# Patient Record
Sex: Female | Born: 1975 | Race: White | Hispanic: No | Marital: Married | Smoking: Former smoker
Health system: Southern US, Community
[De-identification: ages and names within clinical notes are randomized; demographics above are authoritative.]

## PROBLEM LIST (undated history)

## (undated) ENCOUNTER — Inpatient Hospital Stay (HOSPITAL_COMMUNITY): Payer: Self-pay

## (undated) DIAGNOSIS — J45909 Unspecified asthma, uncomplicated: Secondary | ICD-10-CM

## (undated) DIAGNOSIS — D649 Anemia, unspecified: Secondary | ICD-10-CM

## (undated) DIAGNOSIS — F419 Anxiety disorder, unspecified: Secondary | ICD-10-CM

## (undated) DIAGNOSIS — N6009 Solitary cyst of unspecified breast: Secondary | ICD-10-CM

## (undated) DIAGNOSIS — Z6721 Type B blood, Rh negative: Secondary | ICD-10-CM

## (undated) DIAGNOSIS — N83209 Unspecified ovarian cyst, unspecified side: Secondary | ICD-10-CM

## (undated) DIAGNOSIS — B999 Unspecified infectious disease: Secondary | ICD-10-CM

## (undated) HISTORY — DX: Unspecified infectious disease: B99.9

## (undated) HISTORY — DX: Unspecified asthma, uncomplicated: J45.909

## (undated) HISTORY — PX: WISDOM TOOTH EXTRACTION: SHX21

## (undated) HISTORY — DX: Anxiety disorder, unspecified: F41.9

## (undated) HISTORY — DX: Unspecified ovarian cyst, unspecified side: N83.209

## (undated) HISTORY — DX: Solitary cyst of unspecified breast: N60.09

## (undated) HISTORY — DX: Anemia, unspecified: D64.9

## (undated) HISTORY — DX: Type B blood, Rh negative: Z67.21

---

## 1999-12-14 ENCOUNTER — Other Ambulatory Visit: Admission: RE | Admit: 1999-12-14 | Discharge: 1999-12-14 | Payer: Self-pay | Admitting: Internal Medicine

## 2000-08-27 ENCOUNTER — Encounter: Payer: Self-pay | Admitting: Internal Medicine

## 2000-08-27 ENCOUNTER — Encounter: Admission: RE | Admit: 2000-08-27 | Discharge: 2000-08-27 | Payer: Self-pay | Admitting: Internal Medicine

## 2009-04-16 ENCOUNTER — Other Ambulatory Visit: Admission: RE | Admit: 2009-04-16 | Discharge: 2009-04-16 | Payer: Self-pay | Admitting: Family Medicine

## 2010-04-20 ENCOUNTER — Other Ambulatory Visit: Admission: RE | Admit: 2010-04-20 | Discharge: 2010-04-20 | Payer: Self-pay | Admitting: Family Medicine

## 2011-12-05 DIAGNOSIS — N6009 Solitary cyst of unspecified breast: Secondary | ICD-10-CM

## 2011-12-05 HISTORY — DX: Solitary cyst of unspecified breast: N60.09

## 2012-02-19 ENCOUNTER — Ambulatory Visit
Admission: RE | Admit: 2012-02-19 | Discharge: 2012-02-19 | Disposition: A | Discharge status: Home / Self Care | Payer: BC Managed Care – PPO | Source: Ambulatory Visit | Attending: Physician Assistant | Admitting: Physician Assistant

## 2012-02-19 ENCOUNTER — Other Ambulatory Visit: Payer: Self-pay | Admitting: Physician Assistant

## 2012-02-19 DIAGNOSIS — R059 Cough, unspecified: Secondary | ICD-10-CM

## 2012-02-19 DIAGNOSIS — R05 Cough: Secondary | ICD-10-CM

## 2012-03-08 ENCOUNTER — Other Ambulatory Visit: Payer: Self-pay | Admitting: Physician Assistant

## 2012-03-08 ENCOUNTER — Other Ambulatory Visit (HOSPITAL_COMMUNITY)
Admission: RE | Admit: 2012-03-08 | Discharge: 2012-03-08 | Disposition: A | Discharge status: Home / Self Care | Payer: BC Managed Care – PPO | Source: Ambulatory Visit | Attending: Family Medicine | Admitting: Family Medicine

## 2012-03-08 DIAGNOSIS — Z124 Encounter for screening for malignant neoplasm of cervix: Secondary | ICD-10-CM | POA: Insufficient documentation

## 2012-07-24 ENCOUNTER — Telehealth: Payer: Self-pay | Admitting: Obstetrics and Gynecology

## 2012-07-24 NOTE — Telephone Encounter (Signed)
TRIAGE/OB °

## 2012-07-26 ENCOUNTER — Encounter: Payer: Self-pay | Admitting: Obstetrics and Gynecology

## 2012-07-26 ENCOUNTER — Ambulatory Visit (INDEPENDENT_AMBULATORY_CARE_PROVIDER_SITE_OTHER): Payer: BC Managed Care – PPO | Admitting: Obstetrics and Gynecology

## 2012-07-26 VITALS — BP 100/58 | Ht 65.0 in | Wt 127.0 lb

## 2012-07-26 DIAGNOSIS — O219 Vomiting of pregnancy, unspecified: Secondary | ICD-10-CM

## 2012-07-26 MED ORDER — ONDANSETRON HCL 4 MG PO TABS: 4.0000 mg | ORAL_TABLET | Freq: Three times a day (TID) | ORAL | Status: AC | PRN

## 2012-07-26 NOTE — Progress Notes (Signed)
  Subjective:    Stacey Estrada is a 36 y.o. female, G1P0.  Pt recently found out she was pregnant approx 8w 2d by LMP of 05/29/12, c/o nausea x2 weeks and diarrhea x3 weeks. Pt has NOB interview scheduled 08/08/12.Marland Kitchen  Upon awakening feels nausea associated with need to have BM, vomiting with brushing teeth, then able to tolerate diet but feels nausea all day worse at the end of the day. Has divided meals in 6. Quit smoking 2010. No caffeine. Has not tried any medication. No weight loss.  The following portions of the patient's history were reviewed and updated as appropriate: allergies, current medications, past family history.  Review of Systems Pertinent items are noted in HPI. Breast:Negative for breast lump,nipple discharge or nipple retraction Gastrointestinal: Negative for abdominal pain, change in bowel habits or rectal bleeding Urinary:negative   Objective:    BP 100/58  Ht 5\' 5"  (1.651 m)  Wt 127 lb (57.607 kg)  BMI 21.13 kg/m2  LMP 05/29/2012    Weight:  Wt Readings from Last 1 Encounters:  07/26/12 127 lb (57.607 kg)          BMI: Body mass index is 21.13 kg/(m^2).  General Appearance: Alert, appropriate appearance for age. No acute distress   Assessment:    Nausea of pregnancy    Plan:    Options reviewed: Doxylamine/vitamin B6, Phenergan, zofran Will try zofran to help with diarrhea as well Diet reviewed. Stop PNV for now     [facility]

## 2012-08-02 ENCOUNTER — Telehealth: Payer: Self-pay | Admitting: Obstetrics and Gynecology

## 2012-08-02 NOTE — Telephone Encounter (Signed)
Triage/general quest. 

## 2012-08-08 ENCOUNTER — Ambulatory Visit (INDEPENDENT_AMBULATORY_CARE_PROVIDER_SITE_OTHER): Payer: Self-pay | Admitting: Obstetrics and Gynecology

## 2012-08-08 DIAGNOSIS — Z331 Pregnant state, incidental: Secondary | ICD-10-CM

## 2012-08-08 NOTE — Progress Notes (Signed)
NOB interview completed.  Pt left without leaving a urine sample, could not void at time of request and pt left office before getting a urine sample.  Pt will have urine sample done @ NOB work up on 08/22/12 w/ [facility].

## 2012-08-09 LAB — PRENATAL PANEL VII
Antibody Screen: NEGATIVE
Basophils Absolute: 0 10*3/uL (ref 0.0–0.1)
Basophils Relative: 0 % (ref 0–1)
Eosinophils Absolute: 0 10*3/uL (ref 0.0–0.7)
Eosinophils Relative: 1 % (ref 0–5)
HCT: 33.3 % — ABNORMAL LOW (ref 36.0–46.0)
HIV: NONREACTIVE
Hemoglobin: 11.4 g/dL — ABNORMAL LOW (ref 12.0–15.0)
Hepatitis B Surface Ag: NEGATIVE
Lymphocytes Relative: 18 % (ref 12–46)
Lymphs Abs: 1.4 10*3/uL (ref 0.7–4.0)
MCH: 29.9 pg (ref 26.0–34.0)
MCHC: 34.2 g/dL (ref 30.0–36.0)
MCV: 87.4 fL (ref 78.0–100.0)
Monocytes Absolute: 1.1 10*3/uL — ABNORMAL HIGH (ref 0.1–1.0)
Monocytes Relative: 14 % — ABNORMAL HIGH (ref 3–12)
Neutro Abs: 5.4 10*3/uL (ref 1.7–7.7)
Neutrophils Relative %: 67 % (ref 43–77)
Platelets: 292 10*3/uL (ref 150–400)
RBC: 3.81 MIL/uL — ABNORMAL LOW (ref 3.87–5.11)
RDW: 14 % (ref 11.5–15.5)
Rh Type: NEGATIVE
Rubella: 35.9 IU/mL — ABNORMAL HIGH
WBC: 8 10*3/uL (ref 4.0–10.5)

## 2012-08-14 ENCOUNTER — Telehealth: Payer: Self-pay

## 2012-08-14 MED ORDER — ONDANSETRON HCL 8 MG PO TABS: 8.0000 mg | ORAL_TABLET | Freq: Three times a day (TID) | ORAL | Status: DC | PRN

## 2012-08-14 NOTE — Telephone Encounter (Signed)
Zofran refilled 

## 2012-08-16 ENCOUNTER — Telehealth: Payer: Self-pay | Admitting: Obstetrics and Gynecology

## 2012-08-16 NOTE — Telephone Encounter (Signed)
PC to insurance company in order to get preauthorization for Zofran.  Approved with ID # of [id number].  Pt notified that she should have no problems getting refills of her med. Pt EXTREMELY thankful.  ld

## 2012-08-16 NOTE — Telephone Encounter (Signed)
LAURA/REFILL REQ

## 2012-08-22 ENCOUNTER — Ambulatory Visit (INDEPENDENT_AMBULATORY_CARE_PROVIDER_SITE_OTHER): Payer: BC Managed Care – PPO

## 2012-08-22 VITALS — BP 120/60 | Wt 129.0 lb

## 2012-08-22 DIAGNOSIS — Z789 Other specified health status: Secondary | ICD-10-CM

## 2012-08-22 DIAGNOSIS — Z8279 Family history of other congenital malformations, deformations and chromosomal abnormalities: Secondary | ICD-10-CM | POA: Insufficient documentation

## 2012-08-22 DIAGNOSIS — Z8659 Personal history of other mental and behavioral disorders: Secondary | ICD-10-CM

## 2012-08-22 DIAGNOSIS — Z862 Personal history of diseases of the blood and blood-forming organs and certain disorders involving the immune mechanism: Secondary | ICD-10-CM

## 2012-08-22 DIAGNOSIS — Z6721 Type B blood, Rh negative: Secondary | ICD-10-CM | POA: Insufficient documentation

## 2012-08-22 HISTORY — DX: Type B blood, Rh negative: Z67.21

## 2012-08-22 NOTE — Progress Notes (Signed)
Declines genetic screenings  Pap done Feb or Mar 2013

## 2012-09-16 DIAGNOSIS — Z331 Pregnant state, incidental: Secondary | ICD-10-CM | POA: Insufficient documentation

## 2012-09-19 ENCOUNTER — Ambulatory Visit (INDEPENDENT_AMBULATORY_CARE_PROVIDER_SITE_OTHER): Payer: Self-pay | Admitting: Obstetrics and Gynecology

## 2012-09-19 VITALS — BP 100/70 | Wt 136.5 lb

## 2012-09-19 DIAGNOSIS — Z331 Pregnant state, incidental: Secondary | ICD-10-CM

## 2012-09-19 NOTE — Progress Notes (Signed)
[redacted]w[redacted]d No complaints

## 2012-09-19 NOTE — Progress Notes (Signed)
Patient ID: Stacey Estrada, female   DOB: Apr 25, 1976, 36 y.o.   MRN: [id number] [redacted]w[redacted]d Declines genetic testing. N,V resolving. Common discomforts of pg reviewed. Lavera Guise, CNM

## 2012-10-22 ENCOUNTER — Other Ambulatory Visit: Payer: Self-pay | Admitting: Obstetrics and Gynecology

## 2012-10-22 DIAGNOSIS — Z3689 Encounter for other specified antenatal screening: Secondary | ICD-10-CM

## 2012-10-23 ENCOUNTER — Other Ambulatory Visit: Payer: Self-pay | Admitting: Obstetrics and Gynecology

## 2012-10-23 ENCOUNTER — Ambulatory Visit (INDEPENDENT_AMBULATORY_CARE_PROVIDER_SITE_OTHER): Payer: BC Managed Care – PPO

## 2012-10-23 ENCOUNTER — Ambulatory Visit (INDEPENDENT_AMBULATORY_CARE_PROVIDER_SITE_OTHER): Payer: BC Managed Care – PPO | Admitting: Obstetrics and Gynecology

## 2012-10-23 ENCOUNTER — Encounter: Payer: Self-pay | Admitting: Obstetrics and Gynecology

## 2012-10-23 VITALS — BP 100/54 | Wt 144.0 lb

## 2012-10-23 DIAGNOSIS — Z3689 Encounter for other specified antenatal screening: Secondary | ICD-10-CM

## 2012-10-23 DIAGNOSIS — J45909 Unspecified asthma, uncomplicated: Secondary | ICD-10-CM

## 2012-10-23 LAB — US OB DETAIL + 14 WK

## 2012-10-23 NOTE — Progress Notes (Signed)
Doing well. [place] today:  21 5/7 weeks, EDC 02/28/13, c/w dates.  Cervix 3.40, anterior placenta. Normal anatomy and fluid.  Female. Declined genetic testing. Glucola at 28-29 weeks.

## 2012-10-23 NOTE — Progress Notes (Signed)
Pt stated no issues today.  

## 2012-11-18 ENCOUNTER — Encounter: Payer: BC Managed Care – PPO | Admitting: Obstetrics and Gynecology

## 2012-11-21 ENCOUNTER — Ambulatory Visit (INDEPENDENT_AMBULATORY_CARE_PROVIDER_SITE_OTHER): Payer: BC Managed Care – PPO | Admitting: Obstetrics and Gynecology

## 2012-11-21 ENCOUNTER — Encounter: Payer: Self-pay | Admitting: Obstetrics and Gynecology

## 2012-11-21 VITALS — BP 112/60 | Wt 150.0 lb

## 2012-11-21 DIAGNOSIS — Z331 Pregnant state, incidental: Secondary | ICD-10-CM

## 2012-11-21 MED ORDER — PANTOPRAZOLE SODIUM 40 MG PO TBEC: 40.0000 mg | DELAYED_RELEASE_TABLET | Freq: Every day | ORAL | Status: DC

## 2012-11-21 NOTE — Progress Notes (Signed)
[redacted]w[redacted]d Pt has no complaints  Pt declines flu vaccine

## 2012-11-21 NOTE — Progress Notes (Signed)
[redacted]w[redacted]d  GFM Some nausea at night with GERD: protonix prescribed and diet reviewed

## 2012-12-04 NOTE — L&D Delivery Note (Signed)
Delivery Note Pushing started around 0355.  Pushed in Lt lateral position for entire stage.  Pushed great to SVD at 4:40 AM a viable female (name TBA) was delivered via Vaginal, Spontaneous Delivery (Presentation: Right Occiput Anterior).  APGAR: 8, 9; weight TBA .  Newborn w/ spontaneous cry as body delivered; dried and stimulated on mom's abdomen.  Cord doubly clamped and cut by FOB. Placenta status: Intact, Spontaneous, partial [place].  Cord: 3 vessels with the following complications: None.  Cord pH: not collected  Anesthesia: Other Epidural  Episiotomy: None Lacerations: 2nd degree perineal;1st degree; bilateral Labial (Lt repaired; Rt hemostatic) Suture Repair: 3-0 monocryl perineal; 4-0 monocryl labial Est. Blood Loss (mL): 350  Mom to postpartum.  Baby to nursery-stable. BF'ng initiated in L&D recovery.  Desires inpatient circumcision. Will CTO BP's closely, but all WNL currently. Mom, baby, and family all bonding well.   Edouard Gikas H 02/20/2013, 6:52 AM

## 2012-12-12 ENCOUNTER — Encounter: Payer: Self-pay | Admitting: Obstetrics and Gynecology

## 2012-12-12 ENCOUNTER — Ambulatory Visit (INDEPENDENT_AMBULATORY_CARE_PROVIDER_SITE_OTHER): Payer: BC Managed Care – PPO | Admitting: Obstetrics and Gynecology

## 2012-12-12 ENCOUNTER — Other Ambulatory Visit: Payer: BC Managed Care – PPO

## 2012-12-12 VITALS — BP 96/56 | Wt 154.0 lb

## 2012-12-12 DIAGNOSIS — Z331 Pregnant state, incidental: Secondary | ICD-10-CM

## 2012-12-12 DIAGNOSIS — Z3400 Encounter for supervision of normal first pregnancy, unspecified trimester: Secondary | ICD-10-CM

## 2012-12-12 LAB — CBC
HCT: 30.3 % — ABNORMAL LOW (ref 36.0–46.0)
Hemoglobin: 10.4 g/dL — ABNORMAL LOW (ref 12.0–15.0)
MCH: 29.6 pg (ref 26.0–34.0)
MCHC: 34.3 g/dL (ref 30.0–36.0)
MCV: 86.3 fL (ref 78.0–100.0)
Platelets: 238 10*3/uL (ref 150–400)
RBC: 3.51 MIL/uL — ABNORMAL LOW (ref 3.87–5.11)
RDW: 13.2 % (ref 11.5–15.5)
WBC: 7 10*3/uL (ref 4.0–10.5)

## 2012-12-12 NOTE — Progress Notes (Signed)
[redacted]w[redacted]d. Pt is B Neg . She declined flu vaccine. No complaints or concerns today.

## 2012-12-12 NOTE — Progress Notes (Signed)
[redacted]w[redacted]d Doing well. Glucola, hemoglobin, RPR today. Patient is Rh- but so is her husband. Return to office in 2 weeks. Childbirth classes, preterm labor, contraception, circumcision, breast-feeding, exercise, nutrition discussed. Dr. Stefano Gaul

## 2012-12-13 LAB — GLUCOSE TOLERANCE, 1 HOUR (50G) W/O FASTING: Glucose, 1 Hour GTT: 104 mg/dL (ref 70–140)

## 2012-12-13 LAB — RPR

## 2012-12-28 IMAGING — CR DG CHEST 2V
2 series · 2 of 2 positions shown · non-contrast
Comparison: None.

CLINICAL DATA: Cough for 3 weeks, former smoking history

CHEST - 2 VIEW

[view not recorded (1 of 2)]
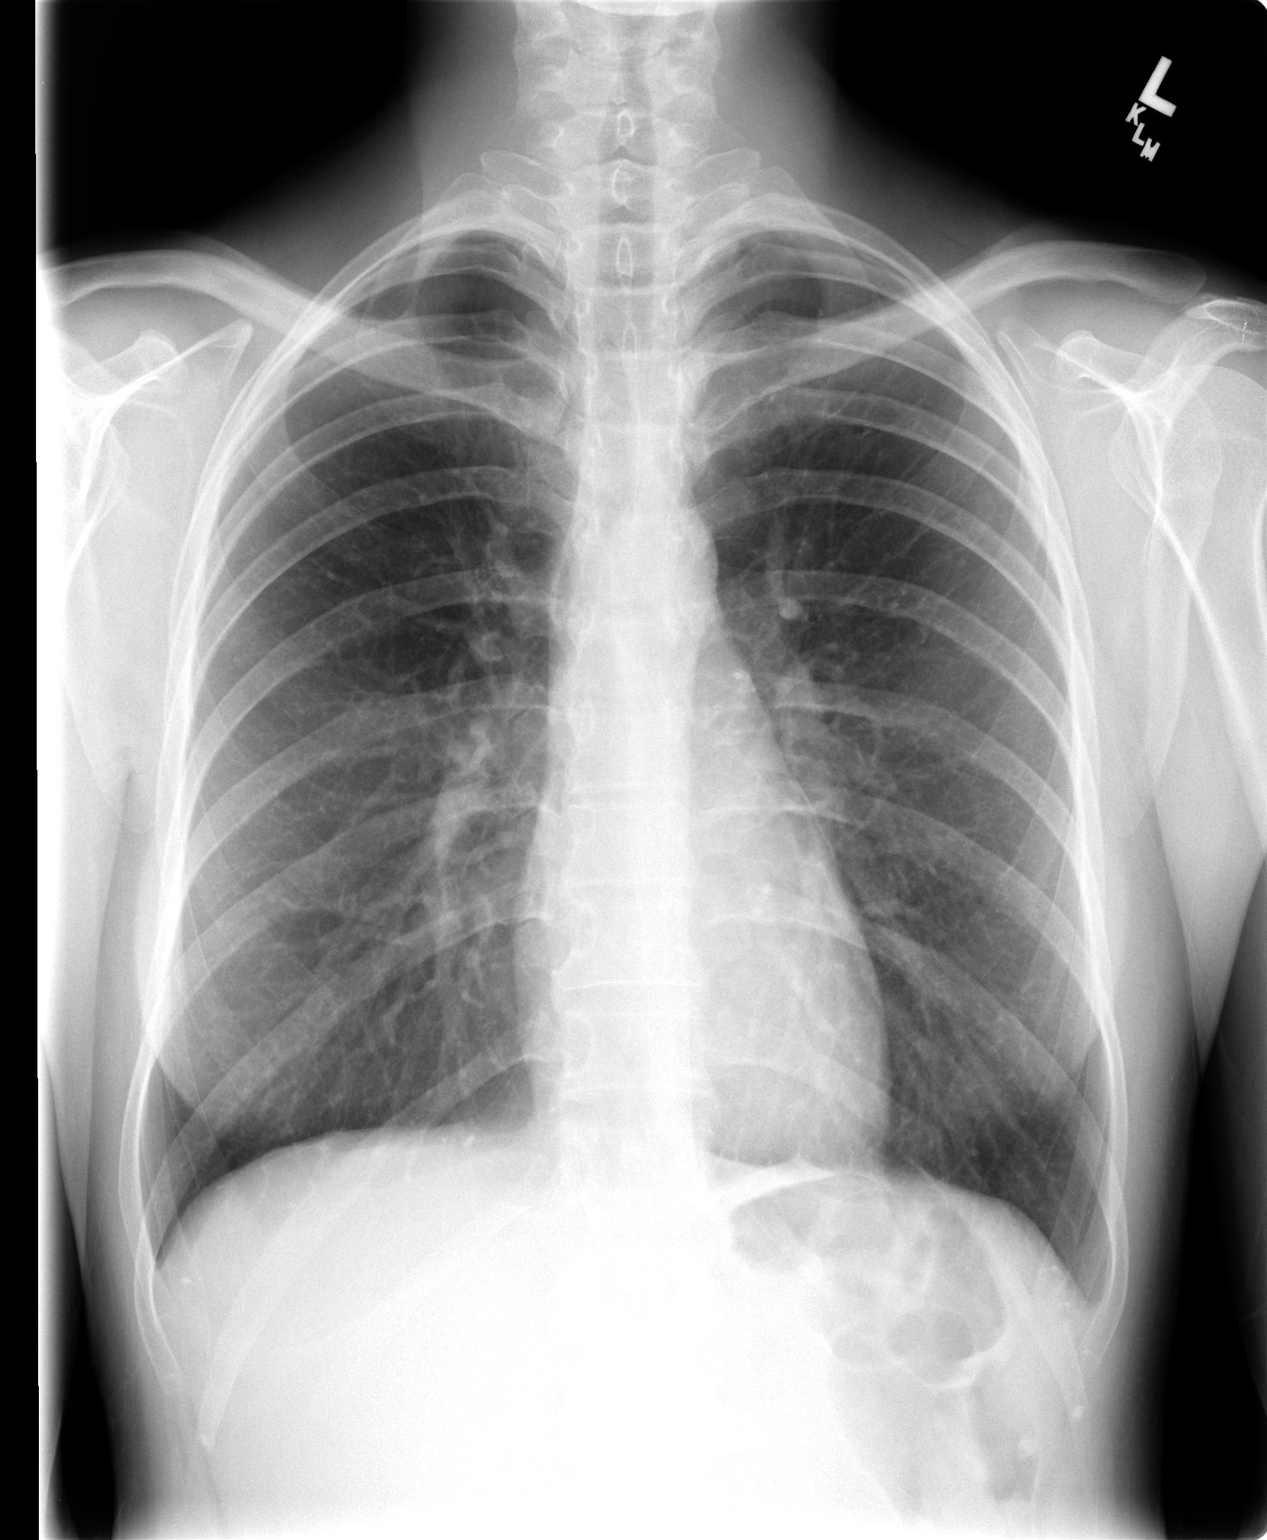

[view not recorded (2 of 2)]
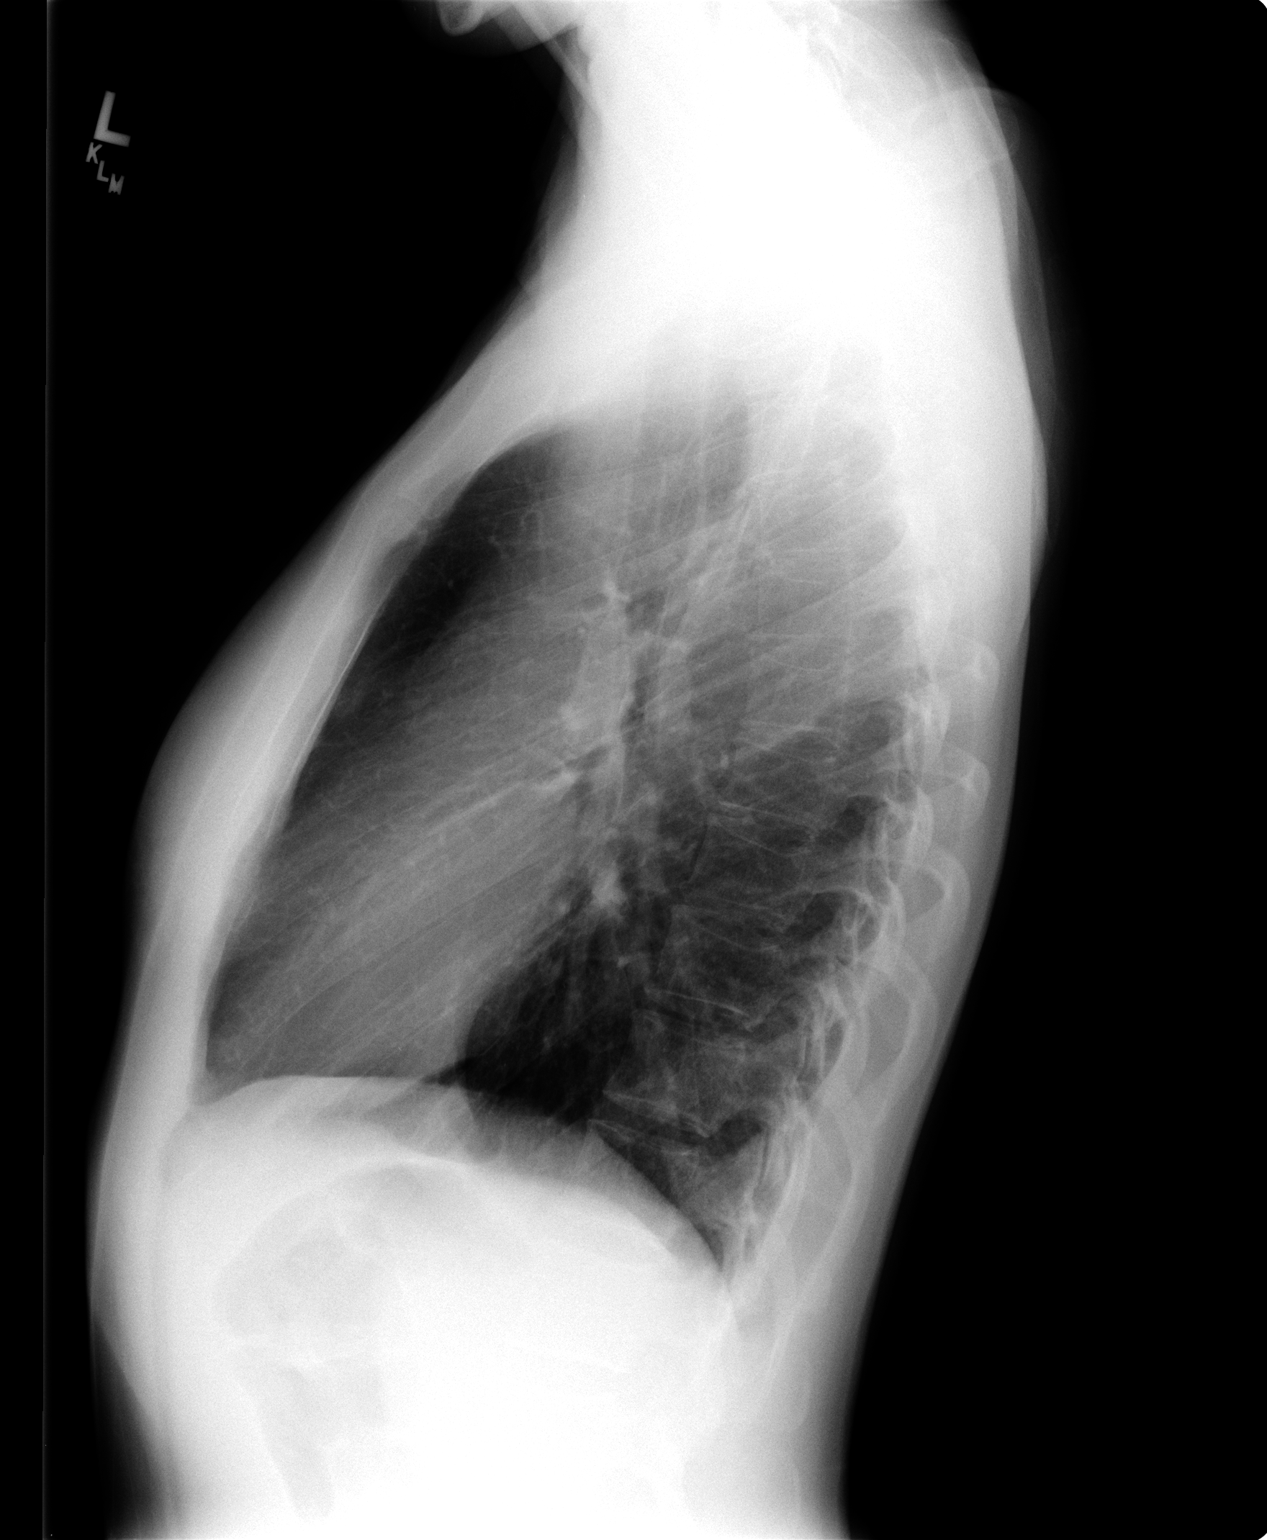

[2 of 2 positions shown; findings below may reference images not displayed]

FINDINGS: The lungs are clear.  Mediastinal contours appear normal.
The heart is within normal limits in size.  No bony abnormality is
seen.
IMPRESSION: No active lung disease.

## 2012-12-31 ENCOUNTER — Ambulatory Visit: Payer: BC Managed Care – PPO | Admitting: Obstetrics and Gynecology

## 2012-12-31 VITALS — BP 104/60 | Wt 157.0 lb

## 2012-12-31 DIAGNOSIS — J45909 Unspecified asthma, uncomplicated: Secondary | ICD-10-CM

## 2012-12-31 MED ORDER — ALBUTEROL SULFATE HFA 108 (90 BASE) MCG/ACT IN AERS: 2.0000 | INHALATION_SPRAY | Freq: Four times a day (QID) | RESPIRATORY_TRACT | Status: AC | PRN

## 2012-12-31 NOTE — Progress Notes (Signed)
[redacted]w[redacted]d 1 hour GTT = 104 Pt states no complaints today.

## 2012-12-31 NOTE — Progress Notes (Signed)
Doing well.  Needs refill on Albuterol inhaler--sent. B negative--husband is same blood type, so no Rhophylac needed. Some ligament pain, reflux, and occasional BHs. All issues reviewed and tips given.  Already on Protonix. S/S PTL reviewed.

## 2013-01-16 ENCOUNTER — Encounter: Payer: Self-pay | Admitting: Obstetrics and Gynecology

## 2013-01-16 ENCOUNTER — Ambulatory Visit: Payer: BC Managed Care – PPO | Admitting: Obstetrics and Gynecology

## 2013-01-16 VITALS — BP 100/62 | Wt 156.0 lb

## 2013-01-16 DIAGNOSIS — Z3490 Encounter for supervision of normal pregnancy, unspecified, unspecified trimester: Secondary | ICD-10-CM

## 2013-01-16 NOTE — Progress Notes (Signed)
[redacted]w[redacted]d No problems today.

## 2013-01-16 NOTE — Progress Notes (Signed)
Doing well. Vtx to [facility]. GBS discussed--will do next visit.

## 2013-01-22 ENCOUNTER — Inpatient Hospital Stay (HOSPITAL_COMMUNITY)
Admission: AD | Admit: 2013-01-22 | Discharge: 2013-01-22 | Disposition: A | Discharge status: Home / Self Care | Payer: BC Managed Care – PPO | Source: Ambulatory Visit | Attending: Obstetrics and Gynecology | Admitting: Obstetrics and Gynecology

## 2013-01-22 ENCOUNTER — Encounter (HOSPITAL_COMMUNITY): Payer: Self-pay | Admitting: *Deleted

## 2013-01-22 ENCOUNTER — Telehealth: Payer: Self-pay | Admitting: Obstetrics and Gynecology

## 2013-01-22 DIAGNOSIS — R0602 Shortness of breath: Secondary | ICD-10-CM | POA: Insufficient documentation

## 2013-01-22 DIAGNOSIS — R079 Chest pain, unspecified: Secondary | ICD-10-CM | POA: Insufficient documentation

## 2013-01-22 DIAGNOSIS — O99891 Other specified diseases and conditions complicating pregnancy: Secondary | ICD-10-CM | POA: Insufficient documentation

## 2013-01-22 DIAGNOSIS — R42 Dizziness and giddiness: Secondary | ICD-10-CM | POA: Insufficient documentation

## 2013-01-22 LAB — COMPREHENSIVE METABOLIC PANEL
ALT: 9 U/L (ref 0–35)
AST: 16 U/L (ref 0–37)
Albumin: 2.9 g/dL — ABNORMAL LOW (ref 3.5–5.2)
Alkaline Phosphatase: 130 U/L — ABNORMAL HIGH (ref 39–117)
BUN: 8 mg/dL (ref 6–23)
CO2: 21 mEq/L (ref 19–32)
Calcium: 8.6 mg/dL (ref 8.4–10.5)
Chloride: 106 mEq/L (ref 96–112)
Creatinine, Ser: 0.55 mg/dL (ref 0.50–1.10)
GFR calc Af Amer: >90 mL/min (ref >90)
GFR calc non Af Amer: >90 mL/min (ref >90)
Glucose, Bld: 77 mg/dL (ref 70–99)
Potassium: 4.2 mEq/L (ref 3.5–5.1)
Sodium: 135 mEq/L (ref 135–145)
Total Bilirubin: 0.1 mg/dL — ABNORMAL LOW (ref 0.3–1.2)
Total Protein: 7.2 g/dL (ref 6.0–8.3)

## 2013-01-22 LAB — CBC
HCT: 35.4 % — ABNORMAL LOW (ref 36.0–46.0)
Hemoglobin: 11.6 g/dL — ABNORMAL LOW (ref 12.0–15.0)
MCH: 28.2 pg (ref 26.0–34.0)
MCHC: 32.8 g/dL (ref 30.0–36.0)
MCV: 86.1 fL (ref 78.0–100.0)
Platelets: 223 10*3/uL (ref 150–400)
RBC: 4.11 MIL/uL (ref 3.87–5.11)
RDW: 12.8 % (ref 11.5–15.5)
WBC: 7.4 10*3/uL (ref 4.0–10.5)

## 2013-01-22 MED ORDER — GI COCKTAIL ~~LOC~~
30.0000 mL | Freq: Once | ORAL | Status: AC
  Administered 2013-01-22: 30 mL via ORAL
  Filled 2013-01-22: qty 30

## 2013-01-22 NOTE — MAU Note (Signed)
Chest pressure, shortness of breath, started on Sat, seemed to increase; using inhaler more- but not seem to be helping.

## 2013-01-22 NOTE — MAU Note (Signed)
Lab called back for draw. resp called for ECG

## 2013-01-22 NOTE — Progress Notes (Signed)
CMP     Component Value Date/Time   NA 135 01/22/2013 1030   K 4.2 01/22/2013 1030   CL 106 01/22/2013 1030   CO2 21 01/22/2013 1030   GLUCOSE 77 01/22/2013 1030   BUN 8 01/22/2013 1030   CREATININE 0.55 01/22/2013 1030   CALCIUM 8.6 01/22/2013 1030   PROT 7.2 01/22/2013 1030   ALBUMIN 2.9* 01/22/2013 1030   AST 16 01/22/2013 1030   ALT 9 01/22/2013 1030   ALKPHOS 130* 01/22/2013 1030   BILITOT 0.1* 01/22/2013 1030   GFRNONAA >90 01/22/2013 1030   GFRAA >90 01/22/2013 1030    The patient reports that she feels better after her GI cocktail.  Discharge the patient home. The patient will followup in the office in 1-2 weeks. She will call for questions or concerns.  Dr. Stefano Gaul

## 2013-01-22 NOTE — MAU Note (Signed)
Patient states for the past few days she has been having a lot of chest fullness and tighteness, has shortness of breath and slight cough, no sore throat or fever. Denies contractions, bleeding, leaking and reports good fetal movement.

## 2013-01-22 NOTE — Consult Note (Signed)
DATE: 01/22/2013  Maternity Admissions Unit History and Physical Exam for an Obstetrics Patient  [name] is a 37 y.o. female, G1P0, at [redacted]w[redacted]d gestation, who presents for evaluation of shortness of breath and chest pain. She has been followed at the [facility]cology division of [facility].  Her pregnancy has been complicated by a history of asthma and gastroesophageal reflux. The patient also has a history of anxiety. She reports for the last week she has had increased shortness of breath with ambulation. She reports having a vague pain in her chest. She has had tingling in her arms and in her face. She currently takes Protonix. She has no GI or GU complaints. See history below.  OB History   Grav Para Term Preterm Abortions TAB SAB Ect Mult Living   1               Past Medical History  Diagnosis Date  . Ovarian cyst   . Anemia     low iron;iron supplements in past, however stomach upset  . Asthma     Triggered by streunous exercise, anxiety and heat;inhaler prn  . Infection     UTI;hx of frequent UTI;last UTI 2012 x 1  . Anxiety     No meds  . Ruptured ovarian cyst     Around late teens/early 20's  . Cyst (solitary) of breast 2013    GYN felt a  cyst in rt breast;upper area and lower area    Prescriptions prior to admission  Medication Sig Dispense Refill  . acetaminophen (TYLENOL) 500 MG tablet Take 1,000 mg by mouth daily as needed for pain.      Marland Kitchen albuterol (PROVENTIL HFA;VENTOLIN HFA) 108 (90 BASE) MCG/ACT inhaler Inhale 2 puffs into the lungs every 6 (six) hours as needed for wheezing.  1 Inhaler  2  . pantoprazole (PROTONIX) 40 MG tablet Take 40 mg by mouth at bedtime.      . pseudoephedrine (SUDAFED) 120 MG 12 hr tablet Take 120 mg by mouth every 12 (twelve) hours as needed for congestion.      . [DISCONTINUED] pantoprazole (PROTONIX) 40 MG tablet Take 1 tablet (40 mg total) by mouth daily.  30 tablet  6    Past  Surgical History  Procedure Laterality Date  . Wisdom tooth extraction      No Known Allergies  Family History: family history includes Anemia in her paternal grandmother; Asthma in her mother; Cancer in her maternal grandmother; Dementia in her maternal grandmother; Hypertension in her paternal grandmother; Migraines in her paternal grandmother; and Scoliosis in her brother.  Social History:  reports that she quit smoking about 7 months ago. Her smoking use included Cigarettes. She smoked 0.00 packs per day. She has never used smokeless tobacco. She reports that she does not drink alcohol or use illicit drugs.  Review of systems: Normal pregnancy complaints.  Admission Physical Exam:    Body mass index is 25.86 kg/(m^2).  Blood pressure 115/80, pulse 90, temperature 98.6 F (37 C), temperature source Oral, resp. rate 16, height 5\' 5"  (1.651 m), weight 155 lb 6.4 oz (70.489 kg), last menstrual period 05/29/2012, SpO2 100.00%.  HEENT:                 Within normal limits Chest:                   Clear, no E To A changes Heart:  Regular rate and rhythm Abdomen:             Gravid and nontender, FH = 34 cm Extremities:          Grossly normal Neurologic exam: Grossly normal Pelvic exam:         Cervix: Not examined  Prenatal labs: ABO, Rh:             B/NEG/-- (09/05 1546) Antibody:              NEG (09/05 1546) Rubella:                  immune RPR:                    NON REAC (01/09 1711)  HBsAg:                 NEGATIVE (09/05 1546)  HIV:                       NON REACTIVE (09/05 1546)                      NST: Category 1; Contractions: few .   CBC    Component Value Date/Time   WBC 7.4 01/22/2013 1030   RBC 4.11 01/22/2013 1030   HGB 11.6* 01/22/2013 1030   HCT 35.4* 01/22/2013 1030   PLT 223 01/22/2013 1030   MCV 86.1 01/22/2013 1030   MCH 28.2 01/22/2013 1030   MCHC 32.8 01/22/2013 1030   RDW 12.8 01/22/2013 1030   LYMPHSABS 1.4 08/08/2012 1546   MONOABS  1.1* 08/08/2012 1546   EOSABS 0.0 08/08/2012 1546   BASOSABS 0.0 08/08/2012 1546    EKG= normal  Wells Criteria Score: 0 ( unlikely that the patient has a pulmonary embolus)   Assessment:  [redacted]w[redacted]d gestation  Shortness of breath   Chest pain  Anemia  Anxiety  Gastroesophageal reflux disease  Plan:  The patient will be given a GI cocktail.  We will check a comprehensive metabolic panel.  I do not believe the patient has a pulmonary embolus nor an infectious process in her lungs. I do not think it is appropriate to do a chest x-ray or an arteriogram.  We will continue to observe. The patient can use Tylenol and antireflux medications as needed.  Return to office in one to 2 weeks or if her symptoms should continue.   Exie Chrismer V 01/22/2013, 11:14 AM

## 2013-01-22 NOTE — Telephone Encounter (Signed)
TC from pt. States since 01/18/13 has felt chest pressure and it is hard to breathe. Especially feels heaviness in lt side. Has asthma and has used inhaler but has not helped. Has usual asthma cough but theses symptoms feels different. +FM. No cont.  Per DR AVS, pt to MAU. Pt verbalizes comprehension.

## 2013-01-30 ENCOUNTER — Encounter: Payer: Self-pay | Admitting: Obstetrics and Gynecology

## 2013-01-30 ENCOUNTER — Ambulatory Visit: Payer: BC Managed Care – PPO | Admitting: Obstetrics and Gynecology

## 2013-01-30 VITALS — BP 102/62 | Wt 158.0 lb

## 2013-01-30 DIAGNOSIS — Z331 Pregnant state, incidental: Secondary | ICD-10-CM

## 2013-01-30 DIAGNOSIS — R079 Chest pain, unspecified: Secondary | ICD-10-CM

## 2013-01-30 LAB — POCT WET PREP (WET MOUNT)
Bacteria Wet Prep HPF POC: NEGATIVE
Clue Cells Wet Prep Whiff POC: NEGATIVE
Trichomonas Wet Prep HPF POC: NEGATIVE
WBC, Wet Prep HPF POC: NEGATIVE

## 2013-01-30 MED ORDER — NYSTATIN-TRIAMCINOLONE 100000-0.1 UNIT/GM-% EX OINT: TOPICAL_OINTMENT | Freq: Two times a day (BID) | CUTANEOUS | Status: DC

## 2013-01-30 NOTE — Progress Notes (Signed)
[redacted]w[redacted]d Recent hospital visit due to L side chest pain. Sx's haven't subsided. Pt wants to discuss with VL. Pt is requesting info regarding leave of absence.

## 2013-01-30 NOTE — Progress Notes (Signed)
Still c/o L chest pain, tightness, and sporadic SOB.  Had w/u at MAU on 2/19 with negative labs and physical exam by Dr. Stefano Gaul. Patient reports hx of palpitations in past, without w/u--says these sensations are similar how she felt when those occurred, without the specific palpitations occurring. 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 Heart RRR without murmur. GBS done today. Wet prep negative, but has some external itching at introitus. Rx Mycolog II cream. 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  Note written today for patient to take. Will refer to cardiology for further evaluation.

## 2013-01-31 ENCOUNTER — Other Ambulatory Visit: Payer: Self-pay | Admitting: Obstetrics and Gynecology

## 2013-01-31 ENCOUNTER — Telehealth: Payer: Self-pay | Admitting: Obstetrics and Gynecology

## 2013-01-31 DIAGNOSIS — R079 Chest pain, unspecified: Secondary | ICD-10-CM

## 2013-01-31 NOTE — Telephone Encounter (Signed)
TC to pt. LM to return call.  

## 2013-01-31 NOTE — Telephone Encounter (Signed)
Message copied by Mason Jim on Fri Jan 31, 2013  3:12 PM ------      Message from: Cornelius Moras      Created: Thu Jan 30, 2013  9:30 PM      Regarding: Referral to cardiology       Please refer ASAP to Cardiology for evaluation of left sided chest pain and SOB at [redacted] weeks gestation.  Had preliminary w/u at [facility] on 2.19, but no CXR or EKG done.  Hx palpitations in past, none at present.  Pain and SOB have persisted.            Thanks!      VL ------

## 2013-01-31 NOTE — Telephone Encounter (Signed)
TC from pt. Informed of cardiology appt.  States +FM.

## 2013-01-31 NOTE — Telephone Encounter (Signed)
TC to pt. Lm to call ASAP. Appt scheduled at [facility] 02/03/13 at 11:45 with Dr Dietrich Pates.

## 2013-01-31 NOTE — Telephone Encounter (Signed)
Message copied by Mason Jim on Fri Jan 31, 2013 10:51 AM ------      Message from: Cornelius Moras      Created: Thu Jan 30, 2013  9:30 PM      Regarding: Referral to cardiology       Please refer ASAP to Cardiology for evaluation of left sided chest pain and SOB at [redacted] weeks gestation.  Had preliminary w/u at [facility] on 2.19, but no CXR or EKG done.  Hx palpitations in past, none at present.  Pain and SOB have persisted.            Thanks!      VL ------

## 2013-02-01 LAB — STREP B DNA PROBE: GBSP: NEGATIVE

## 2013-02-03 ENCOUNTER — Other Ambulatory Visit (HOSPITAL_COMMUNITY): Payer: BC Managed Care – PPO

## 2013-02-03 ENCOUNTER — Encounter: Payer: Self-pay | Admitting: Internal Medicine

## 2013-02-03 ENCOUNTER — Ambulatory Visit (INDEPENDENT_AMBULATORY_CARE_PROVIDER_SITE_OTHER): Payer: BC Managed Care – PPO | Admitting: Internal Medicine

## 2013-02-03 VITALS — BP 110/80 | HR 80 | Ht 65.0 in | Wt 156.0 lb

## 2013-02-03 DIAGNOSIS — R0789 Other chest pain: Secondary | ICD-10-CM

## 2013-02-03 NOTE — Progress Notes (Signed)
HPI First pregnancy  Patient is a 37 yo who was referred fro evaluation of CP and SOB Different sensations  1 is a tight feeling  1 is a tight tingly sensation near top of chest  Makes neck and face feel hot  Feels SOB  Not associated with activity. Has had palpitations  Isolated beats  Has decreased but the feeling of chest  Has increased.  Started 2 to 3 wks ago.  Using inhaler more   2x per day.  Doesn't help  Occasionally hears self wheezing.  Occasaionally feels like has to cough  No PND  No signif edema  No Known Allergies  Current Outpatient Prescriptions  Medication Sig Dispense Refill  . acetaminophen (TYLENOL) 500 MG tablet Take 1,000 mg by mouth daily as needed for pain.      Marland Kitchen albuterol (PROVENTIL HFA;VENTOLIN HFA) 108 (90 BASE) MCG/ACT inhaler Inhale 2 puffs into the lungs every 6 (six) hours as needed for wheezing.  1 Inhaler  2  . nystatin-triamcinolone ointment (MYCOLOG) Apply topically 2 (two) times daily.  30 g  1  . pantoprazole (PROTONIX) 40 MG tablet Take 40 mg by mouth at bedtime.      . pseudoephedrine (SUDAFED) 120 MG 12 hr tablet Take 120 mg by mouth every 12 (twelve) hours as needed for congestion.       No current facility-administered medications for this visit.    Past Medical History  Diagnosis Date  . Ovarian cyst   . Anemia     low iron;iron supplements in past, however stomach upset  . Asthma     Triggered by streunous exercise, anxiety and heat;inhaler prn  . Infection     UTI;hx of frequent UTI;last UTI 2012 x 1  . Anxiety     No meds  . Ruptured ovarian cyst     Around late teens/early 20's  . Cyst (solitary) of breast 2013    GYN felt a  cyst in rt breast;upper area and lower area    Past Surgical History  Procedure Laterality Date  . Wisdom tooth extraction      Family History  Problem Relation Age of Onset  . Hypertension Paternal Grandmother   . Migraines Paternal Grandmother   . Cancer Maternal Grandmother     lymphoma   .  Asthma Mother   . Scoliosis Brother   . Dementia Maternal Grandmother   . Anemia Paternal Grandmother     History   Social History  . Marital Status: Single    Spouse Name: Esther Bradstreet    Number of Children: 0  . Years of Education: 16   Occupational History  . 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 Smoking status: Former Smoker    Types: Cigarettes    Quit date: 06/12/2012  . Smokeless tobacco: Never Used  . Alcohol Use: No     Comment: occasionally;once/month  . Drug Use: No  . Sexually Active: Not Currently -- Female partner(s)    Birth Control/ Protection: Condom, Pill, Injection, Inserts, None   Other Topics Concern  . Not on file   Social History Narrative  . No narrative on file    Review of Systems:  All systems reviewed.  They are negative to the above problem except as previously stated.  Vital Signs: BP 110/80  Pulse 80  Ht 5\' 5"  (1.651 m)  Wt 156 lb (70.761 kg)  BMI 25.96 kg/m2  LMP 05/29/2012  Physical  Exam Patient is in NAD  HEENT:  Normocephalic, atraumatic. EOMI, PERRLA.  Neck: JVP is normal.  No bruits.  Lungs: clear to auscultation. No rales no wheezes.  Heart: Regular rate and rhythm. Normal S1, S2. No S3.   No significant murmurs. PMI not displaced.  Abdomen: Distended.   No hepatomegaly.  Extremities:   Good distal pulses throughout. No lower extremity edema.  Musculoskeletal :moving all extremities.  Neuro:   alert and oriented x3.  CN II-XII grossly intact.   Assessment and Plan:  1.  Chest tightness and SOB  Patient's volume status looks good.  Cardiac exam appears normal.  With symptoms and stage of pregnancy I would recomm echo just to confirm normal LV function.   If normal symptoms are probably related to increased size of uterus pressing on diaphragm along with increased volume/increased cardiac output.  Increased use of inhalers is also probably contributing to symptoms.  Her last day of work is Friday  I  encouraged her to rest, elevate legs  Do activities as tolerated

## 2013-02-03 NOTE — Patient Instructions (Addendum)
Your physician has requested that you have an echocardiogram. Echocardiography is a painless test that uses sound waves to create images of your heart. It provides your doctor with information about the size and shape of your heart and how well your heart's chambers and valves are working. This procedure takes approximately one hour. There are no restrictions for this procedure.   

## 2013-02-04 ENCOUNTER — Ambulatory Visit: Payer: BC Managed Care – PPO | Admitting: Obstetrics and Gynecology

## 2013-02-04 VITALS — BP 100/70 | Wt 159.0 lb

## 2013-02-04 DIAGNOSIS — Z331 Pregnant state, incidental: Secondary | ICD-10-CM

## 2013-02-04 NOTE — Progress Notes (Signed)
[redacted]w[redacted]d Pt has no concerns today.  GBS : done on 01-30-2013: NEGATIVE FKCs and Labor Precautions

## 2013-02-06 ENCOUNTER — Ambulatory Visit (HOSPITAL_COMMUNITY): Payer: BC Managed Care – PPO | Attending: Cardiology | Admitting: Radiology

## 2013-02-06 DIAGNOSIS — R072 Precordial pain: Secondary | ICD-10-CM

## 2013-02-06 DIAGNOSIS — R0789 Other chest pain: Secondary | ICD-10-CM | POA: Insufficient documentation

## 2013-02-06 NOTE — Progress Notes (Signed)
Echocardiogram performed.  

## 2013-02-11 ENCOUNTER — Encounter: Payer: BC Managed Care – PPO | Admitting: Obstetrics and Gynecology

## 2013-02-14 ENCOUNTER — Ambulatory Visit: Payer: BC Managed Care – PPO | Admitting: Obstetrics and Gynecology

## 2013-02-14 VITALS — BP 110/60 | Wt 158.0 lb

## 2013-02-14 DIAGNOSIS — J45909 Unspecified asthma, uncomplicated: Secondary | ICD-10-CM

## 2013-02-14 DIAGNOSIS — Z331 Pregnant state, incidental: Secondary | ICD-10-CM

## 2013-02-14 NOTE — Progress Notes (Signed)
[redacted]w[redacted]d Pt declines flu shot at this time Doing well

## 2013-02-19 ENCOUNTER — Telehealth: Payer: Self-pay | Admitting: Obstetrics and Gynecology

## 2013-02-19 ENCOUNTER — Encounter (HOSPITAL_COMMUNITY): Payer: Self-pay | Admitting: *Deleted

## 2013-02-19 ENCOUNTER — Inpatient Hospital Stay (HOSPITAL_COMMUNITY): Payer: BC Managed Care – PPO | Admitting: Anesthesiology

## 2013-02-19 ENCOUNTER — Inpatient Hospital Stay (HOSPITAL_COMMUNITY)
Admission: AD | Admit: 2013-02-19 | Discharge: 2013-02-22 | DRG: 373 | Disposition: A | Discharge status: Home / Self Care | Payer: BC Managed Care – PPO | Source: Ambulatory Visit | Attending: Obstetrics and Gynecology | Admitting: Obstetrics and Gynecology

## 2013-02-19 ENCOUNTER — Inpatient Hospital Stay (HOSPITAL_COMMUNITY)
Admission: AD | Admit: 2013-02-19 | Discharge: 2013-02-19 | Disposition: A | Discharge status: Home / Self Care | Payer: BC Managed Care – PPO | Source: Ambulatory Visit | Attending: Obstetrics and Gynecology | Admitting: Obstetrics and Gynecology

## 2013-02-19 ENCOUNTER — Encounter (HOSPITAL_COMMUNITY): Payer: Self-pay | Admitting: Anesthesiology

## 2013-02-19 DIAGNOSIS — D649 Anemia, unspecified: Secondary | ICD-10-CM | POA: Diagnosis not present

## 2013-02-19 DIAGNOSIS — O99892 Other specified diseases and conditions complicating childbirth: Secondary | ICD-10-CM | POA: Diagnosis present

## 2013-02-19 DIAGNOSIS — IMO0001 Reserved for inherently not codable concepts without codable children: Secondary | ICD-10-CM

## 2013-02-19 DIAGNOSIS — K219 Gastro-esophageal reflux disease without esophagitis: Secondary | ICD-10-CM | POA: Diagnosis present

## 2013-02-19 DIAGNOSIS — O9903 Anemia complicating the puerperium: Secondary | ICD-10-CM | POA: Diagnosis not present

## 2013-02-19 DIAGNOSIS — O09519 Supervision of elderly primigravida, unspecified trimester: Secondary | ICD-10-CM | POA: Diagnosis present

## 2013-02-19 DIAGNOSIS — O479 False labor, unspecified: Secondary | ICD-10-CM | POA: Insufficient documentation

## 2013-02-19 LAB — URINALYSIS, MICROSCOPIC ONLY
Bilirubin Urine: NEGATIVE
Glucose, UA: NEGATIVE mg/dL
Hgb urine dipstick: NEGATIVE
Ketones, ur: 15 mg/dL — AB
Leukocytes, UA: NEGATIVE
Nitrite: NEGATIVE
Protein, ur: NEGATIVE mg/dL
Specific Gravity, Urine: 1.01 (ref 1.005–1.030)
Urobilinogen, UA: 0.2 mg/dL (ref 0.0–1.0)
pH: 6 (ref 5.0–8.0)

## 2013-02-19 LAB — COMPREHENSIVE METABOLIC PANEL
ALT: 20 U/L (ref 0–35)
AST: 26 U/L (ref 0–37)
Albumin: 3.2 g/dL — ABNORMAL LOW (ref 3.5–5.2)
Alkaline Phosphatase: 204 U/L — ABNORMAL HIGH (ref 39–117)
BUN: 9 mg/dL (ref 6–23)
CO2: 18 mEq/L — ABNORMAL LOW (ref 19–32)
Calcium: 8.7 mg/dL (ref 8.4–10.5)
Chloride: 101 mEq/L (ref 96–112)
Creatinine, Ser: 0.77 mg/dL (ref 0.50–1.10)
GFR calc Af Amer: >90 mL/min (ref >90)
GFR calc non Af Amer: >90 mL/min (ref >90)
Glucose, Bld: 85 mg/dL (ref 70–99)
Potassium: 3.9 mEq/L (ref 3.5–5.1)
Sodium: 134 mEq/L — ABNORMAL LOW (ref 135–145)
Total Bilirubin: 0.1 mg/dL — ABNORMAL LOW (ref 0.3–1.2)
Total Protein: 7.2 g/dL (ref 6.0–8.3)

## 2013-02-19 LAB — LACTATE DEHYDROGENASE: LDH: 208 U/L (ref 94–250)

## 2013-02-19 LAB — CBC
HCT: 36.6 % (ref 36.0–46.0)
Hemoglobin: 12.6 g/dL (ref 12.0–15.0)
MCH: 28.6 pg (ref 26.0–34.0)
MCHC: 34.4 g/dL (ref 30.0–36.0)
MCV: 83.2 fL (ref 78.0–100.0)
Platelets: 224 10*3/uL (ref 150–400)
RBC: 4.4 MIL/uL (ref 3.87–5.11)
RDW: 13.3 % (ref 11.5–15.5)
WBC: 11.3 10*3/uL — ABNORMAL HIGH (ref 4.0–10.5)

## 2013-02-19 LAB — URIC ACID: Uric Acid, Serum: 5.3 mg/dL (ref 2.4–7.0)

## 2013-02-19 LAB — RPR: RPR Ser Ql: NONREACTIVE

## 2013-02-19 MED ORDER — PANTOPRAZOLE SODIUM 40 MG IV SOLR
40.0000 mg | Freq: Once | INTRAVENOUS | Status: AC
  Administered 2013-02-19: 40 mg via INTRAVENOUS
  Filled 2013-02-19: qty 40

## 2013-02-19 MED ORDER — LIDOCAINE HCL (PF) 1 % IJ SOLN
30.0000 mL | INTRAMUSCULAR | Status: DC | PRN
  Filled 2013-02-19: qty 30

## 2013-02-19 MED ORDER — LACTATED RINGERS IV SOLN: 500.0000 mL | Freq: Once | INTRAVENOUS | Status: DC

## 2013-02-19 MED ORDER — OXYCODONE-ACETAMINOPHEN 5-325 MG PO TABS: 1.0000 | ORAL_TABLET | ORAL | Status: DC | PRN

## 2013-02-19 MED ORDER — FENTANYL CITRATE 0.05 MG/ML IJ SOLN: 100.0000 ug | INTRAMUSCULAR | Status: DC | PRN

## 2013-02-19 MED ORDER — FENTANYL 2.5 MCG/ML BUPIVACAINE 1/10 % EPIDURAL INFUSION (WH - ANES): 14.0000 mL/h | INTRAMUSCULAR | Status: DC | PRN

## 2013-02-19 MED ORDER — PHENYLEPHRINE 40 MCG/ML (10ML) SYRINGE FOR IV PUSH (FOR BLOOD PRESSURE SUPPORT): 80.0000 ug | PREFILLED_SYRINGE | INTRAVENOUS | Status: DC | PRN

## 2013-02-19 MED ORDER — ACETAMINOPHEN 325 MG PO TABS: 650.0000 mg | ORAL_TABLET | ORAL | Status: DC | PRN

## 2013-02-19 MED ORDER — PHENYLEPHRINE 40 MCG/ML (10ML) SYRINGE FOR IV PUSH (FOR BLOOD PRESSURE SUPPORT)
80.0000 ug | PREFILLED_SYRINGE | INTRAVENOUS | Status: DC | PRN
  Filled 2013-02-19: qty 5

## 2013-02-19 MED ORDER — EPHEDRINE 5 MG/ML INJ
10.0000 mg | INTRAVENOUS | Status: DC | PRN
  Filled 2013-02-19 (×2): qty 4

## 2013-02-19 MED ORDER — DIPHENHYDRAMINE HCL 50 MG/ML IJ SOLN: 12.5000 mg | INTRAMUSCULAR | Status: DC | PRN

## 2013-02-19 MED ORDER — ONDANSETRON HCL 4 MG/2ML IJ SOLN
4.0000 mg | Freq: Four times a day (QID) | INTRAMUSCULAR | Status: DC | PRN
  Administered 2013-02-19 – 2013-02-20 (×2): 4 mg via INTRAVENOUS
  Filled 2013-02-19 (×2): qty 2

## 2013-02-19 MED ORDER — OXYTOCIN BOLUS FROM INFUSION: 500.0000 mL | INTRAVENOUS | Status: DC

## 2013-02-19 MED ORDER — EPHEDRINE 5 MG/ML INJ
10.0000 mg | INTRAVENOUS | Status: DC | PRN
  Administered 2013-02-19: 10 mg via INTRAVENOUS

## 2013-02-19 MED ORDER — CITRIC ACID-SODIUM CITRATE 334-500 MG/5ML PO SOLN: 30.0000 mL | ORAL | Status: DC | PRN

## 2013-02-19 MED ORDER — IBUPROFEN 600 MG PO TABS
600.0000 mg | ORAL_TABLET | Freq: Four times a day (QID) | ORAL | Status: DC | PRN
  Administered 2013-02-20: 600 mg via ORAL
  Filled 2013-02-19: qty 1

## 2013-02-19 MED ORDER — LACTATED RINGERS IV SOLN
INTRAVENOUS | Status: DC
  Administered 2013-02-19: 125 mL/h via INTRAVENOUS
  Administered 2013-02-19: 20:00:00 via INTRAVENOUS
  Administered 2013-02-19: 125 mL/h via INTRAVENOUS
  Administered 2013-02-20: via INTRAVENOUS

## 2013-02-19 MED ORDER — LIDOCAINE HCL (PF) 1 % IJ SOLN
INTRAMUSCULAR | Status: DC | PRN
  Administered 2013-02-19 (×4): 4 mL

## 2013-02-19 MED ORDER — OXYTOCIN 40 UNITS IN LACTATED RINGERS INFUSION - SIMPLE MED
62.5000 mL/h | INTRAVENOUS | Status: DC
  Administered 2013-02-20: 62.5 mL/h via INTRAVENOUS
  Filled 2013-02-19: qty 1000

## 2013-02-19 MED ORDER — FENTANYL 2.5 MCG/ML BUPIVACAINE 1/10 % EPIDURAL INFUSION (WH - ANES)
14.0000 mL/h | INTRAMUSCULAR | Status: DC | PRN
  Administered 2013-02-19 – 2013-02-20 (×2): 14 mL/h via EPIDURAL
  Filled 2013-02-19 (×2): qty 125

## 2013-02-19 MED ORDER — FENTANYL CITRATE 0.05 MG/ML IJ SOLN
50.0000 ug | INTRAMUSCULAR | Status: DC | PRN
  Administered 2013-02-19: 50 ug via INTRAVENOUS
  Filled 2013-02-19: qty 2

## 2013-02-19 MED ORDER — LACTATED RINGERS IV SOLN
500.0000 mL | INTRAVENOUS | Status: DC | PRN
  Administered 2013-02-19: 500 mL via INTRAVENOUS
  Administered 2013-02-20: 300 mL via INTRAVENOUS

## 2013-02-19 NOTE — Progress Notes (Signed)
Subjective: Pt received epidural around 1730; hot spot on LLQ for a while after, but used PCA and tilted that direction and completely comfortable now.    Objective: BP 124/78  Pulse 100  Temp(Src) 98 F (36.7 C) (Oral)  Resp 18  Ht 5\' 4"  (1.626 m)  Wt 158 lb (71.668 kg)  BMI 27.11 kg/m2  SpO2 100%  LMP 05/29/2012      FHT:  FHR: 135 bpm, variability: moderate,  accelerations:  Present,  decelerations:  Absent UC:   irregular, every 4-6 minutes SVE:   Dilation: 5.5 Effacement (%): 100 Station: -2 Exam by:: Emary Zalar, CNM AROM, large amt clear fluid cx puffy now; around entire os Labs: Lab Results  Component Value Date   WBC 11.3* 02/19/2013   HGB 12.6 02/19/2013   HCT 36.6 02/19/2013   MCV 83.2 02/19/2013   PLT 224 02/19/2013    Assessment / Plan: 1. 38 weeks 2. protracted active phase 3. GBS neg   Labor: protracted Preeclampsia:  labs stable Fetal Wellbeing:  Category I Pain Control:  Epidural I/D:  n/a Anticipated MOD:  NSVD 1. If no cervical change on next exam, will recommend further augmentation w/ Pitocin.  IUPC prn. 2. C/w MD prn.  Hero Mccathern H 02/19/2013, 8:05 PM

## 2013-02-19 NOTE — Progress Notes (Signed)
Called to [facility] to discuss pain options further w/ pt.  Rev'd IV pain medication vs. Epidural.  R/b/a rev'd of both.  After discussion, pt desired "half" dose of IV Fentanyl ( ).  Pt c/o nausea since last exam.  She ordered some broth. Support as needed. C.Hilary Nolyn Swab, CNM

## 2013-02-19 NOTE — Progress Notes (Signed)
Pt stated she was beginning to feel worse again, lightheaded and and "faint"; repositioned pt to left lateral side; gave sips of apple juice; pt stated she began to feel better within 5 minutes she stated she was beginning to feel better

## 2013-02-19 NOTE — MAU Note (Signed)
Pt presents with complaints of contractions that started last night and was evaluated in MAU for a couple of hours and was sent home with no change in her VE, states now her contractions are about 2 to 3 minutes apart and have been since around 8 am

## 2013-02-19 NOTE — Progress Notes (Signed)
Subjective: Pt getting more and more uncomfortable.  Requesting epidural now.  Objective: BP 126/90  Pulse 84  Temp(Src) 97.6 F (36.4 C) (Axillary)  Resp 22  Ht 5\' 4"  (1.626 m)  Wt 158 lb (71.668 kg)  BMI 27.11 kg/m2  SpO2 97%  LMP 05/29/2012      FHT:  FHR: 130 bpm, variability: moderate,  accelerations:  Present,  decelerations:  Absent UC:   irregular, every 3-5 minutes SVE:   Dilation: 5.5 Effacement (%): 100 Station: 0 Exam by:: H. Jerrie Schussler, CNM BBOW, posterior Labs: Lab Results  Component Value Date   WBC 11.3* 02/19/2013   HGB 12.6 02/19/2013   HCT 36.6 02/19/2013   MCV 83.2 02/19/2013   PLT 224 02/19/2013    Assessment / Plan: 1. 38 weeks 2. active labor 3. GBS neg  Labor: Progressing normally Preeclampsia:  labs stable Fetal Wellbeing:  Category I Pain Control:  proceed w/ obtaining epidural I/D:  n/a Anticipated MOD:  NSVD 1. Will insert Foley after comfortable and send cath u/a for protein/urinalysis. 2. MD updated.  Toyia Jelinek H 02/19/2013, 5:24 PM

## 2013-02-19 NOTE — Progress Notes (Signed)
Subjective: Pt coping well w/ ctxs w/ frequent position changes; her mom is now also at bs w/ pt's s.o.  Requested VE.  Using birthing ball.  Objective: BP 137/94  Pulse 94  Temp(Src) 97.9 F (36.6 C) (Oral)  Resp 18  LMP 05/29/2012     .Marland Kitchen Filed Vitals:   02/19/13 1241 02/19/13 1326 02/19/13 1357 02/19/13 1407  BP:  137/94    Pulse:  96 94   Temp:      TempSrc:      Resp: 18 20  18    FHT:  FHR: 140 bpm, variability: moderate,  accelerations:  Abscent,  decelerations:  Absent UC:   regular, every 2-4 minutes SVE:   Dilation: 4.5 Effacement (%): 100 Station: 0 Exam by:: H. Cayman Kielbasa, CNM Membranes swept; BBOW Labs: .Marland Kitchen Results for orders placed during the hospital encounter of 02/19/13 (from the past 24 hour(s))  COMPREHENSIVE METABOLIC PANEL     Status: Abnormal   Collection Time    02/19/13 12:20 PM      Result Value Range   Sodium 134 (*) 135 - 145 mEq/L   Potassium 3.9  3.5 - 5.1 mEq/L   Chloride 101  96 - 112 mEq/L   CO2 18 (*) 19 - 32 mEq/L   Glucose, Bld 85  70 - 99 mg/dL   BUN 9  6 - 23 mg/dL   Creatinine, Ser 4.09  0.50 - 1.10 mg/dL   Calcium 8.7  8.4 - 81.1 mg/dL   Total Protein 7.2  6.0 - 8.3 g/dL   Albumin 3.2 (*) 3.5 - 5.2 g/dL   AST 26  0 - 37 U/L   ALT 20  0 - 35 U/L   Alkaline Phosphatase 204 (*) 39 - 117 U/L   Total Bilirubin 0.1 (*) 0.3 - 1.2 mg/dL   GFR calc non Af Amer >90  >90 mL/min   GFR calc Af Amer >90  >90 mL/min  URIC ACID     Status: None   Collection Time    02/19/13 12:20 PM      Result Value Range   Uric Acid, Serum 5.3  2.4 - 7.0 mg/dL  LACTATE DEHYDROGENASE     Status: None   Collection Time    02/19/13 12:20 PM      Result Value Range   LDH 208  94 - 250 U/L  CBC     Status: Abnormal   Collection Time    02/19/13 12:20 PM      Result Value Range   WBC 11.3 (*) 4.0 - 10.5 K/uL   RBC 4.40  3.87 - 5.11 MIL/uL   Hemoglobin 12.6  12.0 - 15.0 g/dL   HCT 91.4  78.2 - 95.6 %   MCV 83.2  78.0 - 100.0 fL   MCH 28.6  26.0 - 34.0  pg   MCHC 34.4  30.0 - 36.0 g/dL   RDW 21.3  08.6 - 57.8 %   Platelets 224  150 - 400 K/uL    Assessment / Plan: 1. 38 weeks 2. active labor 3. BP stable despite initial elevation and PIH labs WNL 4. GBS neg  Labor: Progressing normally Preeclampsia:  labs stable Fetal Wellbeing:  Category I Pain Control:  Labor support without medications I/D:  n/a Anticipated MOD:  NSVD 1. CTO closely and support as needed.  Lorin Hauck H 02/19/2013, 2:29 PM

## 2013-02-19 NOTE — H&P (Signed)
Stacey Estrada is a 37 y.o. white female presenting unannounced for labor check.  Seen in MAU overnight and d/c'd home for no cervical progression and cx=1cm at time of d/c.  Reports some bloody show.  No UTI or PIH s/s.  GFM.  Accompanied by s.o. "Marcos."  No resp c/o's; has had GERD since 2nd trimester and on Protonix, but no change in condition.  Reports ctxs intermittently since day before.  Prenatal Course: Pt entered care at [facility] at 10 weeks.  Her pregnancy has been uncomplicated.  Declined flu shot and aneuploidy screening.  Normal anatomy u/s at 21 weeks.  Rx'd Protonix for frequent night-time nausea and GERD.  Normal gtt.  Around 34 weeks, pt seen in MAU for lt sided chest pain/tightness, SOB, received GI cocktail and all labs normal; Referred to Cardiology thereafter; unsure if pt kept referral.  Around same time, c/o external itching/irritation and Rx'd Mycolog II cream.  OB Hx: G1=current  Maternal Medical History:  Reason for admission: Contractions.   Contractions: Onset was yesterday.   Frequency: regular.   Perceived severity is moderate.   ctxs stronger over the past hr  Fetal activity: Perceived fetal activity is normal.   Last perceived fetal movement was within the past hour.      OB History   Grav Para Term Preterm Abortions TAB SAB Ect Mult Living   1              Past Medical History  Diagnosis Date  . Ovarian cyst   . Anemia     low iron;iron supplements in past, however stomach upset  . Asthma     Triggered by streunous exercise, anxiety and heat;inhaler prn  . Infection     UTI;hx of frequent UTI;last UTI 2012 x 1  . Anxiety     No meds  . Ruptured ovarian cyst     Around late teens/early 20's  . Cyst (solitary) of breast 2013    GYN felt a  cyst in rt breast;upper area and lower area   Past Surgical History  Procedure Laterality Date  . Wisdom tooth extraction     Family History: family history includes Anemia in her paternal grandmother;  Asthma in her mother; Cancer in her maternal grandmother; Dementia in her maternal grandmother; Hypertension in her paternal grandmother; Migraines in her paternal grandmother; and Scoliosis in her brother. Social History:  reports that she quit smoking about 8 months ago. Her smoking use included Cigarettes. She smoked 0.00 packs per day. She has never used smokeless tobacco. She reports that she does not drink alcohol or use illicit drugs.Pt is a Runner, broadcasting/film/video.   Prenatal Transfer Tool  Maternal Diabetes: No Genetic Screening: Declined Maternal Ultrasounds/Referrals: Normal Fetal Ultrasounds or other Referrals:  None Maternal Substance Abuse:  No Significant Maternal Medications:  Meds include: Protonix Significant Maternal Lab Results:  Lab values include: Group B Strep negative Other Comments:  None  Review of Systems  Constitutional: Negative.   HENT: Negative.   Eyes: Negative.   Respiratory: Negative.   Cardiovascular: Negative.   Gastrointestinal: Negative.   Genitourinary: Negative.   Skin: Negative.   Neurological: Negative.     Dilation: 3.5 Effacement (%): 100 Station: 0 Exam by:: H Kamy Poinsett CNM Blood pressure 155/96, pulse 95, resp. rate 18, last menstrual period 05/29/2012. Maternal Exam:  Uterine Assessment: Contraction strength is moderate.  Contraction frequency is regular.  q 2-4 min  Abdomen: Patient reports no abdominal tenderness. Fetal presentation: vertex  Introitus: Normal  vulva. Pelvis: adequate for delivery.   Cervix: Cervix evaluated by digital exam.     Fetal Exam Fetal Monitor Review: Mode: ultrasound.   Baseline rate: 135.  Variability: moderate (6-25 bpm).   Pattern: no decelerations and no accelerations.    Fetal State Assessment: Category I - tracings are normal.     Physical Exam  Constitutional: She is oriented to person, place, and time. She appears well-developed and well-nourished. She appears distressed.  Breathing heavily and  grimace and tense during ctxs  HENT:  Head: Normocephalic and atraumatic.  Eyes: Pupils are equal, round, and reactive to light.  Cardiovascular: Normal rate.   Respiratory: Effort normal.  GI: Soft.  gravid  Genitourinary:  Cx: 3.5/100/-1 to 0, intact, vtx  Musculoskeletal: She exhibits no edema.  Neurological: She is alert and oriented to person, place, and time. She has normal reflexes.  Skin: Skin is warm and dry.  Psychiatric: She has a normal mood and affect. Her behavior is normal. Judgment and thought content normal.    Prenatal labs: ABO, Rh: B/NEG/-- (09/05 1546) Antibody: NEG (09/05 1546) Rubella: 35.9 (09/05 1546) RPR: NON REAC (01/09 1711)  HBsAg: NEGATIVE (09/05 1546)  HIV: NON REACTIVE (09/05 1546)  GBS: NEGATIVE (02/27 1655)  1hr gtt=104 Assessment/Plan: 1. [redacted]w[redacted]d 2. Active labor 3. GBS neg 4. Elevated BP on arrival; normal when d/c'd home from MAU early morning; likely pain related 5. Cat I FHT  1. Admit to [facility] w/ Dr. Stefano Gaul as attending 2. Routine L&D orders 3. Support as needed; pt declines pain interventions at present 4. C/w MD prn 5. PIH labs sent  Lasaro Primm H 02/19/2013, 12:17 PM

## 2013-02-19 NOTE — MAU Provider Note (Signed)
History   37 yo G1P0 at 38 weeks presented c/o contractions x 24 hours, with increase in frequency and intensity this am.  Denies leaking or bleeding, reports +FM.  Cervix was closed in the office 02/14/13.  Patient Active Problem List  Diagnosis  . Pregnant state, incidental  . Asthma  . Chest pain during pregnancy  Had recent cardiac evaluation on 3/3 by Dr. Dietrich Pates, with normal echo on 02/06/13.  History of present pregnancy: Patient entered care at 12 1/7 weeks.   EDC of 03/05/13 was established by LMP and in agreement with [place] at 21 weeks.   Anatomy scan:  21 weeks, with normal findings and an anterior placenta.   Additional [place] evaluations:  None  Significant prenatal events:  Did not need Rhophylac due to FOB also Rh negative.  Evaluated in MAU 2/19 for chest pain, with no significant findings.  Referred to cardiologist--had normal evaluation and echo 02/03/13 and 02/06/13.  Declined flu vaccine   Last evaluation:  02/14/13, cervix closed, 70%.  OB History   Grav Para Term Preterm Abortions TAB SAB Ect Mult Living   1               Past Medical History  Diagnosis Date  . Ovarian cyst   . Anemia     low iron;iron supplements in past, however stomach upset  . Asthma     Triggered by streunous exercise, anxiety and heat;inhaler prn  . Infection     UTI;hx of frequent UTI;last UTI 2012 x 1  . Anxiety     No meds  . Ruptured ovarian cyst     Around late teens/early 20's  . Cyst (solitary) of breast 2013    GYN felt a  cyst in rt breast;upper area and lower area    Past Surgical History  Procedure Laterality Date  . Wisdom tooth extraction      Family History  Problem Relation Age of Onset  . Hypertension Paternal Grandmother   . Migraines Paternal Grandmother   . Anemia Paternal Grandmother   . Cancer Maternal Grandmother     lymphoma   . Dementia Maternal Grandmother   . Asthma Mother   . Scoliosis Brother    Social hx--previous smoker before pregnancy, stopped last  year.  Is a Runner, broadcasting/film/video.  Married to FOB, Stacey Estrada, who is involved and supportive.  Denies domestic violence.  History  Substance Use Topics  . Smoking status: Former Smoker    Types: Cigarettes    Quit date: 06/12/2012  . Smokeless tobacco: Never Used  . Alcohol Use: No     Comment: occasionally;once/month    Allergies: No Known Allergies  Prescriptions prior to admission  Medication Sig Dispense Refill  . acetaminophen (TYLENOL) 500 MG tablet Take 1,000 mg by mouth daily as needed for pain.      Marland Kitchen albuterol (PROVENTIL HFA;VENTOLIN HFA) 108 (90 BASE) MCG/ACT inhaler Inhale 2 puffs into the lungs every 6 (six) hours as needed for wheezing.  1 Inhaler  2  . pantoprazole (PROTONIX) 40 MG tablet Take 40 mg by mouth at bedtime.      . pseudoephedrine (SUDAFED) 120 MG 12 hr tablet Take 120 mg by mouth every 12 (twelve) hours as needed for congestion.      Marland Kitchen nystatin-triamcinolone ointment (MYCOLOG) Apply topically 2 (two) times daily.  30 g  1    Prenatal Transfer Tool  Maternal Diabetes: No Genetic Screening: Declined Maternal Ultrasounds/Referrals: Normal Fetal Ultrasounds or other Referrals:  None Maternal Substance Abuse:  No Significant Maternal Medications:  None Significant Maternal Lab Results: Lab values include: Group B Strep negative, Rh negative (did not need Rhophylac due to FOB also Rh negative.     Physical Exam   Blood pressure 127/85, pulse 98, temperature 97.9 F (36.6 C), temperature source Oral, resp. rate 20, height 5\' 4"  (1.626 m), weight 158 lb 8 oz (71.895 kg), last menstrual period 05/29/2012.  Chest clear Heart RRR without murmur Abd gravid, NT Pelvic--cervix 1 cm, 90%, vtx, -1. Ext WNL  FHR negative spontaneous CST, no decels UCs q 3-4 min, moderate  ED Course  IUP at 38 weeks Early vs prodromal labor GBS negative  Plan: Ambulate x 1-2 hours, then re-evaluate.   [name] CNM, [place] 02/19/2013 4:11 AM  Addendum: Returned from  ambulation--UCs same.  FHR negative spontaneous CST UCs q 2-5 min, mild/moderate  Cervix unchanged--1 cm, 90%, vtx, -1, IBOW  D/C'd home with labor precautions. To call/return prn.  [name], CNM 02/19/13 6:15am

## 2013-02-19 NOTE — Progress Notes (Signed)
Subjective: Pt didn't like the way of Fentanyl made her feel. She has also been nauseated further since.  She is considering epidural.  No LOF or VB.  Pt's mom and s.o. Remain at bs & supportive.  Pt is sitting on bed holding trashcan.  Objective: BP 126/90  Pulse 84  Temp(Src) 97.6 F (36.4 C) (Axillary)  Resp 20  Ht 5\' 4"  (1.626 m)  Wt 158 lb (71.668 kg)  BMI 27.11 kg/m2  SpO2 97%  LMP 05/29/2012      FHT:  FHR: 135 bpm, variability: moderate,  accelerations:  Present,  decelerations:  Absent UC:   irregular, every 3-6 minutes SVE:   Deferred exam Labs: Lab Results  Component Value Date   WBC 11.3* 02/19/2013   HGB 12.6 02/19/2013   HCT 36.6 02/19/2013   MCV 83.2 02/19/2013   PLT 224 02/19/2013    Assessment / Plan: 1. 38 weeks 2. active labor 3. GBS neg  Labor: Progressing normally Preeclampsia:  labs stable Fetal Wellbeing:  Category I Pain Control:  Fentanyl I/D:  n/a Anticipated MOD:  NSVD 1. Support as needed.  Epidural prn pt desire. 2. AROM/ Pitocin prn augmentation. 3. C/w MD prn.  Ramel Tobon H 02/19/2013, 4:37 PM

## 2013-02-19 NOTE — Anesthesia Preprocedure Evaluation (Signed)
Anesthesia Evaluation  Patient identified by MRN, date of birth, ID band Patient awake    Reviewed: Allergy & Precautions, H&P , NPO status , Patient's Chart, lab work & pertinent test results, reviewed documented beta blocker date and time   History of Anesthesia Complications Negative for: history of anesthetic complications  Airway Mallampati: I TM Distance: >3 FB Neck ROM: full    Dental  (+) Teeth Intact   Pulmonary asthma (last inhaler use 2 weeks ago) , former smoker (quit in [place]),  breath sounds clear to auscultation        Cardiovascular negative cardio ROS  Rhythm:regular Rate:Normal     Neuro/Psych negative neurological ROS  negative psych ROS   GI/Hepatic Neg liver ROS, GERD-  Medicated,  Endo/Other  negative endocrine ROS  Renal/GU negative Renal ROS  negative genitourinary   Musculoskeletal   Abdominal   Peds  Hematology negative hematology ROS (+)   Anesthesia Other Findings   Reproductive/Obstetrics (+) Pregnancy                           Anesthesia Physical Anesthesia Plan  ASA: II  Anesthesia Plan: Epidural   Post-op Pain Management:    Induction:   Airway Management Planned:   Additional Equipment:   Intra-op Plan:   Post-operative Plan:   Informed Consent: I have reviewed the patients History and Physical, chart, labs and discussed the procedure including the risks, benefits and alternatives for the proposed anesthesia with the patient or authorized representative who has indicated his/her understanding and acceptance.     Plan Discussed with:   Anesthesia Plan Comments:         Anesthesia Quick Evaluation

## 2013-02-19 NOTE — MAU Note (Signed)
PT SAYS SHE HURT BAD SINCE 1045PM.   VE IN OFFICE- CLOSED.    DENIES HSV AND MRSA

## 2013-02-19 NOTE — Telephone Encounter (Signed)
TC from patient--G1P0, 38 weeks, UCs all day, now q 3-5 last several hours, "very painful". Denies leaking or bleeding, reports +FM. GBS negative. Will come to MAU when desires.

## 2013-02-19 NOTE — Anesthesia Procedure Notes (Signed)
Epidural Patient location during procedure: OB Start time: 02/19/2013 5:39 PM  Staffing Performed by: anesthesiologist   Preanesthetic Checklist Completed: patient identified, site marked, surgical consent, pre-op evaluation, timeout performed, IV checked, risks and benefits discussed and monitors and equipment checked  Epidural Patient position: sitting Prep: site prepped and draped and DuraPrep Patient monitoring: continuous pulse ox and blood pressure Approach: midline Injection technique: LOR air  Needle:  Needle type: Tuohy  Needle gauge: 17 G Needle length: 9 cm and 9 Needle insertion depth: 4.5 cm Catheter type: closed end flexible Catheter size: 19 Gauge Catheter at skin depth: 9.5 cm Test dose: negative  Assessment Events: blood not aspirated, injection not painful, no injection resistance, negative IV test and no paresthesia  Additional Notes Discussed risk of headache, infection, bleeding, nerve injury and failed or incomplete block.  Patient voices understanding and wishes to proceed.  Epidural placed easily on first attempt.  No paresthesia.  Patient tolerated procedure well with no apparent complications.  Jasmine December, MD Reason for block:procedure for pain

## 2013-02-19 NOTE — Progress Notes (Signed)
Pt supine for SVE and foley catheter placement; pt stated she was feeling lightheaded and nauseated; sat pt up to semi folwers position; started LR bolus

## 2013-02-19 NOTE — Progress Notes (Signed)
Subjective: Pt comfortable.  Visited w/ her dad and brothers recently.  Pt got nauseated and dizziness while just inserting foley and supine.    Objective: BP 100/74  Pulse 110  Temp(Src) 98.2 F (36.8 C) (Axillary)  Resp 20  Ht 5\' 4"  (1.626 m)  Wt 158 lb (71.668 kg)  BMI 27.11 kg/m2  SpO2 100%  LMP 05/29/2012      FHT:  FHR: 135 bpm, variability: moderate,  accelerations:  Present,  decelerations:  Absent UC:   regular, every 4-5 minutes SVE:   7/100/0; more cx on pt's Rt; small amt of bloody show; clear fluid continues to leak Labs: Lab Results  Component Value Date   WBC 11.3* 02/19/2013   HGB 12.6 02/19/2013   HCT 36.6 02/19/2013   MCV 83.2 02/19/2013   PLT 224 02/19/2013    Assessment / Plan: Spontaneous labor, progressing normally  Labor: Progressing normally Preeclampsia:  labs stable Fetal Wellbeing:  Category I Pain Control:  Epidural I/D:  n/a Anticipated MOD:  NSVD 1. Will send u/a since BP elevated on admission, but normalized after admitted and PIH labs normal 2. CTO for labor progression; Pitocin prn.  Alazar Cherian H 02/19/2013, 10:07 PM

## 2013-02-20 ENCOUNTER — Encounter (HOSPITAL_COMMUNITY): Payer: Self-pay

## 2013-02-20 MED ORDER — LANOLIN HYDROUS EX OINT: TOPICAL_OINTMENT | CUTANEOUS | Status: DC | PRN

## 2013-02-20 MED ORDER — MAGNESIUM HYDROXIDE 400 MG/5ML PO SUSP: 30.0000 mL | ORAL | Status: DC | PRN

## 2013-02-20 MED ORDER — BENZOCAINE-MENTHOL 20-0.5 % EX AERO
1.0000 "application " | INHALATION_SPRAY | CUTANEOUS | Status: DC | PRN
  Administered 2013-02-20: 1 via TOPICAL
  Filled 2013-02-20: qty 56

## 2013-02-20 MED ORDER — IBUPROFEN 600 MG PO TABS
600.0000 mg | ORAL_TABLET | Freq: Four times a day (QID) | ORAL | Status: DC
  Administered 2013-02-20 – 2013-02-22 (×8): 600 mg via ORAL
  Filled 2013-02-20 (×8): qty 1

## 2013-02-20 MED ORDER — SENNOSIDES-DOCUSATE SODIUM 8.6-50 MG PO TABS
2.0000 | ORAL_TABLET | Freq: Every day | ORAL | Status: DC
  Administered 2013-02-20 – 2013-02-21 (×2): 2 via ORAL

## 2013-02-20 MED ORDER — ZOLPIDEM TARTRATE 5 MG PO TABS: 5.0000 mg | ORAL_TABLET | Freq: Every evening | ORAL | Status: DC | PRN

## 2013-02-20 MED ORDER — WITCH HAZEL-GLYCERIN EX PADS: 1.0000 "application " | MEDICATED_PAD | CUTANEOUS | Status: DC | PRN

## 2013-02-20 MED ORDER — ACETAMINOPHEN 650 MG RE SUPP
650.0000 mg | Freq: Once | RECTAL | Status: AC
  Administered 2013-02-20: 650 mg via RECTAL
  Filled 2013-02-20: qty 1

## 2013-02-20 MED ORDER — PRENATAL MULTIVITAMIN CH
1.0000 | ORAL_TABLET | Freq: Every day | ORAL | Status: DC
  Administered 2013-02-20 – 2013-02-21 (×2): 1 via ORAL
  Filled 2013-02-20 (×2): qty 1

## 2013-02-20 MED ORDER — DIPHENHYDRAMINE HCL 25 MG PO CAPS: 25.0000 mg | ORAL_CAPSULE | Freq: Four times a day (QID) | ORAL | Status: DC | PRN

## 2013-02-20 MED ORDER — SIMETHICONE 80 MG PO CHEW: 80.0000 mg | CHEWABLE_TABLET | ORAL | Status: DC | PRN

## 2013-02-20 MED ORDER — MEDROXYPROGESTERONE ACETATE 150 MG/ML IM SUSP: 150.0000 mg | INTRAMUSCULAR | Status: DC | PRN

## 2013-02-20 MED ORDER — TETANUS-DIPHTH-ACELL PERTUSSIS 5-2.5-18.5 LF-MCG/0.5 IM SUSP: 0.5000 mL | Freq: Once | INTRAMUSCULAR | Status: DC

## 2013-02-20 MED ORDER — MEASLES, MUMPS & RUBELLA VAC ~~LOC~~ INJ
0.5000 mL | INJECTION | Freq: Once | SUBCUTANEOUS | Status: DC
  Filled 2013-02-20: qty 0.5

## 2013-02-20 MED ORDER — ONDANSETRON HCL 4 MG PO TABS: 4.0000 mg | ORAL_TABLET | ORAL | Status: DC | PRN

## 2013-02-20 MED ORDER — OXYCODONE-ACETAMINOPHEN 5-325 MG PO TABS: 1.0000 | ORAL_TABLET | ORAL | Status: DC | PRN

## 2013-02-20 MED ORDER — DIBUCAINE 1 % RE OINT: 1.0000 "application " | TOPICAL_OINTMENT | RECTAL | Status: DC | PRN

## 2013-02-20 MED ORDER — ONDANSETRON HCL 4 MG/2ML IJ SOLN: 4.0000 mg | INTRAMUSCULAR | Status: DC | PRN

## 2013-02-20 NOTE — Anesthesia Postprocedure Evaluation (Signed)
  Anesthesia Post-op Note  Patient: Stacey Estrada  Procedure(s) Performed: * No procedures listed *  Patient Location: Mother/Baby  Anesthesia Type:Epidural  Level of Consciousness: awake  Airway and Oxygen Therapy: Patient Spontanous Breathing  Post-op Pain: none  Post-op Assessment: Patient's Cardiovascular Status Stable, Respiratory Function Stable, Patent Airway, No signs of Nausea or vomiting, Adequate PO intake, Pain level controlled, No headache, No backache, No residual numbness and No residual motor weakness  Post-op Vital Signs: Reviewed and stable  Complications: No apparent anesthesia complications

## 2013-02-20 NOTE — Progress Notes (Signed)
Patient was referred for history of depression/anxiety. * Referral screened out by Clinical Social Worker because none of the following criteria appear to apply:  ~ History of anxiety/depression during this pregnancy, or of post-partum depression.  ~ Diagnosis of anxiety and/or depression within last 3 years  ~ History of depression due to pregnancy loss/loss of child  OR * Patient's symptoms currently being treated with medication and/or therapy.  Please contact the Clinical Social Worker if needs arise, or by the patient's request. Pt was never diagnosed with anxiety.

## 2013-02-20 NOTE — Progress Notes (Signed)
Went to patient's room to attempt trial pushes; upon entering room patient states she "feels bad again, like before" with nausea and lightheadedness; sat HOB up; gave patient apple juice to sip on; bolus of LR infusing at this time

## 2013-02-20 NOTE — Progress Notes (Signed)
Subjective: Pt woke up from sleep and n/v and shaking.  Does not feel like she can begin pushing.    Objective: BP 132/72  Pulse 114  Temp(Src) 99.7 F (37.6 C) (Axillary)  Resp 18  Ht 5\' 4"  (1.626 m)  Wt 158 lb (71.668 kg)  BMI 27.11 kg/m2  SpO2 98%  LMP 05/29/2012   Total I/O In: -  Out: 2400 [Urine:2100; Emesis/NG output:300]  FHT:  FHR: 145 bpm, variability: minimal ,  accelerations:  Abscent,  decelerations:  Present mild variables/earlies UC:   regular, every 4-5 minutes SVE:   Dilation: 10 Effacement (%): 100 Station: +2 Exam by:: Tymon Nemetz, CNM  Labs: Lab Results  Component Value Date   WBC 11.3* 02/19/2013   HGB 12.6 02/19/2013   HCT 36.6 02/19/2013   MCV 83.2 02/19/2013   PLT 224 02/19/2013    Assessment / Plan: 1. [redacted]w[redacted]d 2. Temp rising 3. n/v likely r/t advanced stage of labor   Labor: 2nd stage Preeclampsia:  no signs or symptoms of toxicity Fetal Wellbeing:  Category II Pain Control:  Epidural I/D:  n/a Anticipated MOD:  NSVD 1. Pt just turned to Rt side and given Zofran.  Will do Tylenol supp now.   2.  Push when pt feeling better. 3.  C/w MD prn. Zyara Riling H 02/20/2013, 3:08 AM

## 2013-02-20 NOTE — Progress Notes (Signed)
Subjective: Pt resting w/o pain; does feel intermittent vaginal pressure.  Pt's mom and s.o. Resting at bs.    Objective: BP 106/58  Pulse 94  Temp(Src) 98.4 F (36.9 C) (Oral)  Resp 16  Ht 5\' 4"  (1.626 m)  Wt 158 lb (71.668 kg)  BMI 27.11 kg/m2  SpO2 98%  LMP 05/29/2012   Total I/O In: -  Out: 1400 [Urine:1400]  FHT:  FHR: 145 bpm, variability: moderate,  accelerations:  Present,  decelerations:  Present intermittent mild variables/earlies UC:   regular, every 3-5 minutes SVE:   Dilation: 10 Effacement (%): 100 Station: +2 Exam by:: Nettie Cromwell, CNM  Labs: Lab Results  Component Value Date   WBC 11.3* 02/19/2013   HGB 12.6 02/19/2013   HCT 36.6 02/19/2013   MCV 83.2 02/19/2013   PLT 224 02/19/2013    Assessment / Plan: 1. Begin 2nd stage 2. GBS neg  Labor: spontaneous labor Preeclampsia:  no signs or symptoms of toxicity Fetal Wellbeing:  Category II Pain Control:  Epidural I/D:  n/a Anticipated MOD:  NSVD 1. Will labor down 1-2 hrs, or push before prn. 2. C/w MD prn 3.  Pt's u/s negative for protein; 15 ketones only finding.  Stacey Estrada H 02/20/2013, 1:02 AM

## 2013-02-21 LAB — CBC
HCT: 29 % — ABNORMAL LOW (ref 36.0–46.0)
Hemoglobin: 9.5 g/dL — ABNORMAL LOW (ref 12.0–15.0)
MCH: 27.9 pg (ref 26.0–34.0)
MCHC: 32.8 g/dL (ref 30.0–36.0)
MCV: 85.3 fL (ref 78.0–100.0)
Platelets: 171 10*3/uL (ref 150–400)
RBC: 3.4 MIL/uL — ABNORMAL LOW (ref 3.87–5.11)
RDW: 14 % (ref 11.5–15.5)
WBC: 18.2 10*3/uL — ABNORMAL HIGH (ref 4.0–10.5)

## 2013-02-21 NOTE — Progress Notes (Signed)
Post Partum Day 1 NSVD Subjective: no complaints and tolerating PO  Objective: Blood pressure 113/76, pulse 71, temperature 97.6 F (36.4 C), temperature source Oral, resp. rate 18, height 5\' 4"  (1.626 m), weight 158 lb (71.668 kg), last menstrual period 05/29/2012, SpO2 98.00%, unknown if currently breastfeeding.  Physical Exam:  General: alert Lochia: appropriate Uterine Fundus: firm Incision: NA DVT Evaluation: No evidence of DVT seen on physical exam.   Recent Labs  02/19/13 1220 02/21/13 0644  HGB 12.6 9.5*  HCT 36.6 29.0*    Assessment/Plan:  Doing well S/P NSVD Anemia Desires circumcision  Breast feeding Undecided about contraception Risks and benefits of circumcision discussed Anemia treatment discussed Possible discharge today   LOS: 2 days   Jadea Shiffer V 02/21/2013, 8:50 AM

## 2013-02-22 MED ORDER — IBUPROFEN 800 MG PO TABS: 800.0000 mg | ORAL_TABLET | Freq: Three times a day (TID) | ORAL | Status: DC | PRN

## 2013-02-22 MED ORDER — OXYCODONE-ACETAMINOPHEN 5-325 MG PO TABS: 1.0000 | ORAL_TABLET | ORAL | Status: DC | PRN

## 2013-02-22 MED ORDER — FERROUS SULFATE 325 (65 FE) MG PO TABS: 325.0000 mg | ORAL_TABLET | Freq: Two times a day (BID) | ORAL | Status: DC

## 2013-02-22 NOTE — Discharge Summary (Signed)
  Vaginal Delivery Discharge Summary  Stacey Estrada  DOB:    [birth year] MRN:    [id number] CSN:    [id number]  Date of admission:                  02/19/13  Date of discharge:                   02/22/13  Procedures this admission:  02/20/13  NSVD by Denny Levy, CNM  Newborn Data:  Live born female  Birth Weight: 6 lb 8 oz (2948 g) APGAR: 8, 9  Home with mother. Name: [name] Circ: Yes  History of Present Illness:  [name] is a 37 y.o. female, G1P1001, who presents at [redacted]w[redacted]d weeks gestation. The patient has been followed at the [facility] and Gynecology division of [facility]. She was admitted onset of labor. Her pregnancy has been complicated by: Asthma, GERD.  Hospital course:  The patient was admitted for labor.   Her labor was not complicated. She proceeded to have a vaginal delivery of a healthy infant. Her delivery was not complicated. Her postpartum course was not complicated. She was discharged to home on postpartum day 2 doing well.  Feeding:  breast  Contraception:  Undecided  Discharge hemoglobin:  Hemoglobin  Date Value Range Status  02/21/2013 9.5* 12.0 - 15.0 g/dL Final     REPEATED TO VERIFY     DELTA CHECK NOTED     HCT  Date Value Range Status  02/21/2013 29.0* 36.0 - 46.0 % Final    Discharge Physical Exam:   General: no distress Lochia: appropriate Uterine Fundus: firm Incision: NA DVT Evaluation: No evidence of DVT seen on physical exam.  Intrapartum Procedures: spontaneous vaginal delivery Postpartum Procedures: none Complications-Operative and Postpartum: Second degree laceration.  Discharge Diagnoses: Term Pregnancy-delivered and Anemia, GERD,Asthma  Discharge Information:  Activity:           pelvic rest Diet:                routine Medications: PNV, Ibuprofen, Iron and Percocet, Protonix Condition:      stable and improved Instructions:  EPIC instructions for vaginal  delivery and PP Blues Discharge to: home  Follow-up Information   Follow up with [facility] In 6 weeks.   Contact information:   [address] [place] [address]        Janine Limbo 02/22/2013

## 2013-05-02 ENCOUNTER — Encounter (HOSPITAL_COMMUNITY): Payer: Self-pay

## 2013-05-02 ENCOUNTER — Inpatient Hospital Stay (HOSPITAL_COMMUNITY)
Admission: AD | Admit: 2013-05-02 | Discharge: 2013-05-02 | Disposition: A | Discharge status: Home / Self Care | Payer: BC Managed Care – PPO | Source: Ambulatory Visit | Attending: Obstetrics and Gynecology | Admitting: Obstetrics and Gynecology

## 2013-05-02 DIAGNOSIS — O9122 Nonpurulent mastitis associated with the puerperium: Secondary | ICD-10-CM | POA: Insufficient documentation

## 2013-05-02 DIAGNOSIS — N61 Mastitis without abscess: Secondary | ICD-10-CM | POA: Diagnosis present

## 2013-05-02 DIAGNOSIS — N644 Mastodynia: Secondary | ICD-10-CM | POA: Insufficient documentation

## 2013-05-02 DIAGNOSIS — R509 Fever, unspecified: Secondary | ICD-10-CM | POA: Insufficient documentation

## 2013-05-02 MED ORDER — CEPHALEXIN 500 MG PO CAPS: 500.0000 mg | ORAL_CAPSULE | Freq: Four times a day (QID) | ORAL | Status: AC

## 2013-05-02 MED ORDER — ACETAMINOPHEN 500 MG PO TABS: 1000.0000 mg | ORAL_TABLET | Freq: Four times a day (QID) | ORAL | Status: DC

## 2013-05-02 MED ORDER — IBUPROFEN 600 MG PO TABS: 600.0000 mg | ORAL_TABLET | Freq: Four times a day (QID) | ORAL | Status: DC

## 2013-05-02 NOTE — MAU Note (Signed)
Left breast tenderness noted yesterday.  Reddened area on side of breast.  Achy, with fever, chills.  Highest temp 100.2.  Vag delivery 10 wks ago, is breast feeding.

## 2013-05-02 NOTE — MAU Note (Signed)
Pt states started having redness and pain in her left breast. Feels knot. Very painful.

## 2013-05-02 NOTE — MAU Provider Note (Signed)
History   37 yo G1P1 presented after calling office with fever and redness/pain in left breast x 2 days, with flu-like sx today.  Patient is breastfeeding her 64 month old.  No nipple trauma, breastfeeding going well.  No previous mastitis or breast yeast.  Temp max of 100.1 today--took Ibuprophen 800 mg at 1pm.  Denies viral sx, cough, nasal congestion, dysuria, back pain, pelvic pain, d/c, or any other sx.  Patient Active Problem List   Diagnosis Date Noted  . Acute mastitis of left breast 05/02/2013  . NSVD (normal spontaneous vaginal delivery) 02/20/2013  . Second degree laceration of perineum, delivered, current hospitalization 02/20/2013  . Obstetric labial laceration, delivered, current hospitalization 02/20/2013  . Chest pain during pregnancy 01/30/2013  . Asthma 10/23/2012  . Pregnant state, incidental 09/16/2012     Chief Complaint  Patient presents with  . Breast Pain  . Fever     OB History   Grav Para Term Preterm Abortions TAB SAB Ect Mult Living   1 1 1  0 0 0 0 0 0 1      Past Medical History  Diagnosis Date  . Ovarian cyst   . Anemia     low iron;iron supplements in past, however stomach upset  . Asthma     Triggered by streunous exercise, anxiety and heat;inhaler prn  . Infection     UTI;hx of frequent UTI;last UTI 2012 x 1  . Anxiety     No meds  . Ruptured ovarian cyst     Around late teens/early 20's  . Cyst (solitary) of breast 2013    GYN felt a  cyst in rt breast;upper area and lower area  . NSVD (normal spontaneous vaginal delivery) 02/20/2013    Past Surgical History  Procedure Laterality Date  . Wisdom tooth extraction      Family History  Problem Relation Age of Onset  . Hypertension Paternal Grandmother   . Migraines Paternal Grandmother   . Anemia Paternal Grandmother   . Cancer Maternal Grandmother     lymphoma   . Dementia Maternal Grandmother   . Asthma Mother   . Scoliosis Brother     History  Substance Use Topics  .  Smoking status: Former Smoker    Types: Cigarettes    Quit date: 06/12/2012  . Smokeless tobacco: Never Used  . Alcohol Use: No     Comment: occasionally;once/month    Allergies: No Known Allergies  Prescriptions prior to admission  Medication Sig Dispense Refill  . [DISCONTINUED] acetaminophen (TYLENOL) 500 MG tablet Take 1,000 mg by mouth daily as needed for pain.      . [DISCONTINUED] ibuprofen (ADVIL,MOTRIN) 800 MG tablet Take 1 tablet (800 mg total) by mouth every 8 (eight) hours as needed for pain.  50 tablet  1  . albuterol (PROVENTIL HFA;VENTOLIN HFA) 108 (90 BASE) MCG/ACT inhaler Inhale 2 puffs into the lungs every 6 (six) hours as needed for wheezing.  1 Inhaler  2     Physical Exam   Blood pressure 114/68, pulse 88, temperature 99.5 F (37.5 C), temperature source Oral, resp. rate 18, currently breastfeeding.  Chest clear Heart RRR without murmur Breasts--Right breast clear, lactating, nipple without trauma.  Left breast has erythematous/warm area to left of nipple, approx 4 cm in diameter.  Nipple pink, but no trauma noted.  No evidence of abscess. Abd soft, NT Ext WNL     ED Course  Left breast mastitis in breastfeeding mom (10 months pp)  Plan: Rx Keflex 500 mg po QID x 7 days. Mastitis precautions/observations reviewed. Alternate Ibuprophen and Tylenol for fever control for 24 hours ATC, then prn. To call with any worsening sx, or if no improvement in 24-48 hours.  [name], CNM 05/02/13 6:50p   [name] CNM, [place] 05/02/2013 6:46 PM

## 2013-07-23 NOTE — Progress Notes (Signed)
..   Subjective:    Stacey Estrada "Stacey Estrada" is being seen today for her first obstetrical visit.  Interested in natural childbirth/waterbirth.    She is [redacted]w[redacted]d determined by: Patient's last menstrual period was 05/29/2012.Marland Kitchen  Ultrasound: NO  Relationship w FOB: married  She denies any vag bleeding, cramping, or discharge.  She reports nausea/vomiting is improving; seen by SR around 8 weeks for n/v & rx'd Zofran.  Her obstetrical history is significant for: 1. AMA 2. H/o anxiety--no meds 3. H/o anemia  4. H/o frequent UTIs 5. H/o asthma 6. Rh neg 7. Recent smoking hx--stopped 06/12/12 8. FOB's son born w/ cleft lip  Review of Systems Pertinent ROS is described in HPI .Marland KitchenNo Known Allergies .Marland Kitchen Past Medical History  Diagnosis Date  . Ovarian cyst   . Anemia     low iron;iron supplements in past, however stomach upset  . Asthma     Triggered by streunous exercise, anxiety and heat;inhaler prn  . Infection     UTI;hx of frequent UTI;last UTI 2012 x 1  . Anxiety     No meds  . Ruptured ovarian cyst     Around late teens/early 20's  . Cyst (solitary) of breast 2013    GYN felt a  cyst in rt breast;upper area and lower area  . NSVD (normal spontaneous vaginal delivery) 02/20/2013  . Type b blood, rh negative 08/22/2012  .Marland Kitchen Past Surgical History  Procedure Laterality Date  . Wisdom tooth extraction       Objective:   BP 120/60  Wt 129 lb (58.514 kg)  BMI 21.47 kg/m2  LMP 05/29/2012 Wt Readings from Last 1 Encounters:  02/19/13 158 lb (71.668 kg)   BMI: Body mass index is 21.47 kg/(m^2).  General: alert, cooperative and no distress HEENT: grossly normal  Thyroid: normal  Respiratory: clear to auscultation bilaterally Cardiovascular: regular rate and rhythm  Breasts:  No dominant masses, nipples erect Gastrointestinal: soft, non-tender; no masses,  no organomegaly Extremities: extremities normal, no pain or edema  EXTERNAL GENITALIA: normal appearing vulva with no  masses, tenderness or lesions VAGINA: no abnormal discharge, bleeding or lesions CERVIX: no lesions or cervical motion tenderness; cervix closed, long, firm UTERUS: gravid and consistent with 12-13 weeks ADNEXA: no masses palpable and nontender OB EXAM PELVIMETRY: appears adequate  Assessment:    Primagravida at  [redacted]w[redacted]d AMA Rh neg H/o anxiety H/o anemia H/o frequent UTIs Recent smoker: stopped 06/12/12 H/o asthma Plan:     Prenatal labs rv'd; WNL from 08/08/12 Pap smear collected:  No; reports nml in Feb or March 2013 GC/Chlamydia collected:  no Wet prep:  Not done Discussion of Genetic testing options: declines Problem list reviewed and updated. rv'd how and when to call for emergencies rv'd practice routines Discussed nutrition and exercise and common pregnancy discomforts   F/u 4 weeks for ROB, or prn  C. Denny Levy, CNM

## 2013-12-31 ENCOUNTER — Other Ambulatory Visit: Payer: Self-pay | Admitting: Internal Medicine

## 2013-12-31 DIAGNOSIS — M25561 Pain in right knee: Secondary | ICD-10-CM

## 2014-05-20 LAB — OB RESULTS CONSOLE HIV ANTIBODY (ROUTINE TESTING): HIV: NONREACTIVE

## 2014-05-20 LAB — OB RESULTS CONSOLE ANTIBODY SCREEN: Antibody Screen: NEGATIVE

## 2014-05-20 LAB — OB RESULTS CONSOLE GC/CHLAMYDIA
Chlamydia: NEGATIVE
Gonorrhea: NEGATIVE

## 2014-05-20 LAB — OB RESULTS CONSOLE ABO/RH: RH Type: NEGATIVE

## 2014-05-20 LAB — OB RESULTS CONSOLE HEPATITIS B SURFACE ANTIGEN: Hepatitis B Surface Ag: NEGATIVE

## 2014-12-04 NOTE — L&D Delivery Note (Signed)
[id number]: [facility]o [facility] to [facility].  Upon arrival to room, bulging bag noted at introitus.  Provider gowned and AROM performed.  Patient pushing with staff and family encouragement.  FHR remained reassuring.  Patient delivered as below.   Delivery Note At 9:03 AM a viable female "Helaine ChessKalen" was delivered via Vaginal, Spontaneous Delivery (Presentation: Left Occiput Anterior ). After delivery of head, infant noted to have a nuchal cord.  Shoulders delivered easily and infant delivered through nuchal via somersault maneuver.  Infant with good tone and removal of membranes, from face/mouth, and bulb suction with removal of ~222mL fluid brought about vigorous cry.  Infant held up for family to see and then placed on mother's abdomen were nurses provided tactile stimulation.  Infant APGARs: 9, 9. Cord clamped, cut, and blood collected.  Vaginal inspection revealed a mild 2nd degree laceration that was repaired with use of 1% lidocaine injected locally. Placenta manually expressed and noted to be intact with 3VC upon inspection. Patient tolerated with support and was given 10mg  of IM pitocin in left leg. Fundus firm, at the umbilicus, and bleeding small.  Mother hemodynamically stable and infant at the breast prior to provider exit.  Mother desires condoms for birth control method.   Family wishes for infant to be circumcised during his inpatient stay. Infant weight at one hour of life: 7lbs 9.5oz, 20in  Anesthesia: Lidocain for Repair Episiotomy: None Lacerations: Mild 2nd degree Suture Repair: 3.0 vicryl Est. Blood Loss (mL):  100  Mom to postpartum.  Baby to Couplet care / Skin to Skin.  Breshay Ilg Larita FifeLYNN, MSN, CNM 01/03/2015, 9:51 AM

## 2015-01-03 ENCOUNTER — Encounter (HOSPITAL_COMMUNITY): Payer: Self-pay | Admitting: *Deleted

## 2015-01-03 ENCOUNTER — Inpatient Hospital Stay (HOSPITAL_COMMUNITY)
Admission: AD | Admit: 2015-01-03 | Discharge: 2015-01-05 | DRG: 775 | Disposition: A | Discharge status: Home / Self Care | Payer: BLUE CROSS/BLUE SHIELD | Source: Ambulatory Visit | Attending: Obstetrics and Gynecology | Admitting: Obstetrics and Gynecology

## 2015-01-03 DIAGNOSIS — IMO0001 Reserved for inherently not codable concepts without codable children: Secondary | ICD-10-CM

## 2015-01-03 DIAGNOSIS — Z3A38 38 weeks gestation of pregnancy: Secondary | ICD-10-CM | POA: Diagnosis present

## 2015-01-03 DIAGNOSIS — Z6721 Type B blood, Rh negative: Secondary | ICD-10-CM

## 2015-01-03 DIAGNOSIS — O26893 Other specified pregnancy related conditions, third trimester: Secondary | ICD-10-CM | POA: Diagnosis present

## 2015-01-03 DIAGNOSIS — Z6791 Unspecified blood type, Rh negative: Secondary | ICD-10-CM | POA: Diagnosis present

## 2015-01-03 MED ORDER — OXYCODONE-ACETAMINOPHEN 5-325 MG PO TABS: 1.0000 | ORAL_TABLET | ORAL | Status: DC | PRN

## 2015-01-03 MED ORDER — PRENATAL MULTIVITAMIN CH
1.0000 | ORAL_TABLET | Freq: Every day | ORAL | Status: DC
  Administered 2015-01-03 – 2015-01-05 (×3): 1 via ORAL
  Filled 2015-01-03 (×3): qty 1

## 2015-01-03 MED ORDER — ONDANSETRON HCL 4 MG/2ML IJ SOLN: 4.0000 mg | Freq: Four times a day (QID) | INTRAMUSCULAR | Status: DC | PRN

## 2015-01-03 MED ORDER — ACETAMINOPHEN 325 MG PO TABS: 650.0000 mg | ORAL_TABLET | ORAL | Status: DC | PRN

## 2015-01-03 MED ORDER — NALBUPHINE HCL 10 MG/ML IJ SOLN: 10.0000 mg | INTRAMUSCULAR | Status: DC | PRN

## 2015-01-03 MED ORDER — DIBUCAINE 1 % RE OINT
1.0000 "application " | TOPICAL_OINTMENT | RECTAL | Status: DC | PRN
  Administered 2015-01-03: 1 via RECTAL
  Filled 2015-01-03 (×2): qty 28

## 2015-01-03 MED ORDER — LACTATED RINGERS IV SOLN: INTRAVENOUS | Status: DC

## 2015-01-03 MED ORDER — DIPHENHYDRAMINE HCL 25 MG PO CAPS: 25.0000 mg | ORAL_CAPSULE | Freq: Four times a day (QID) | ORAL | Status: DC | PRN

## 2015-01-03 MED ORDER — TETANUS-DIPHTH-ACELL PERTUSSIS 5-2.5-18.5 LF-MCG/0.5 IM SUSP: 0.5000 mL | Freq: Once | INTRAMUSCULAR | Status: DC

## 2015-01-03 MED ORDER — IBUPROFEN 600 MG PO TABS
600.0000 mg | ORAL_TABLET | Freq: Four times a day (QID) | ORAL | Status: DC
  Administered 2015-01-03 – 2015-01-05 (×9): 600 mg via ORAL
  Filled 2015-01-03 (×9): qty 1

## 2015-01-03 MED ORDER — OXYTOCIN BOLUS FROM INFUSION: 500.0000 mL | INTRAVENOUS | Status: DC

## 2015-01-03 MED ORDER — LACTATED RINGERS IV SOLN: 500.0000 mL | INTRAVENOUS | Status: DC | PRN

## 2015-01-03 MED ORDER — OXYCODONE-ACETAMINOPHEN 5-325 MG PO TABS: 2.0000 | ORAL_TABLET | ORAL | Status: DC | PRN

## 2015-01-03 MED ORDER — CITRIC ACID-SODIUM CITRATE 334-500 MG/5ML PO SOLN: 30.0000 mL | ORAL | Status: DC | PRN

## 2015-01-03 MED ORDER — SIMETHICONE 80 MG PO CHEW
80.0000 mg | CHEWABLE_TABLET | ORAL | Status: DC | PRN
Start: 2015-01-03 — End: 2015-01-05

## 2015-01-03 MED ORDER — OXYTOCIN 40 UNITS IN LACTATED RINGERS INFUSION - SIMPLE MED: 62.5000 mL/h | INTRAVENOUS | Status: DC

## 2015-01-03 MED ORDER — ONDANSETRON HCL 4 MG/2ML IJ SOLN: 4.0000 mg | INTRAMUSCULAR | Status: DC | PRN

## 2015-01-03 MED ORDER — WITCH HAZEL-GLYCERIN EX PADS: 1.0000 "application " | MEDICATED_PAD | CUTANEOUS | Status: DC | PRN

## 2015-01-03 MED ORDER — SENNOSIDES-DOCUSATE SODIUM 8.6-50 MG PO TABS
2.0000 | ORAL_TABLET | ORAL | Status: DC
  Administered 2015-01-04 (×2): 2 via ORAL
  Filled 2015-01-03 (×2): qty 2

## 2015-01-03 MED ORDER — ONDANSETRON HCL 4 MG PO TABS: 4.0000 mg | ORAL_TABLET | ORAL | Status: DC | PRN

## 2015-01-03 MED ORDER — LIDOCAINE HCL (PF) 1 % IJ SOLN
30.0000 mL | INTRAMUSCULAR | Status: AC | PRN
  Administered 2015-01-03: 30 mL via SUBCUTANEOUS
  Filled 2015-01-03: qty 30

## 2015-01-03 MED ORDER — LANOLIN HYDROUS EX OINT: TOPICAL_OINTMENT | CUTANEOUS | Status: DC | PRN

## 2015-01-03 MED ORDER — OXYTOCIN 10 UNIT/ML IJ SOLN
INTRAMUSCULAR | Status: AC
  Administered 2015-01-03: 10 [IU]
  Filled 2015-01-03: qty 2

## 2015-01-03 MED ORDER — OXYCODONE-ACETAMINOPHEN 5-325 MG PO TABS
1.0000 | ORAL_TABLET | ORAL | Status: DC | PRN
  Administered 2015-01-03 – 2015-01-04 (×3): 1 via ORAL
  Filled 2015-01-03 (×3): qty 1

## 2015-01-03 MED ORDER — ZOLPIDEM TARTRATE 5 MG PO TABS
5.0000 mg | ORAL_TABLET | Freq: Every evening | ORAL | Status: DC | PRN
Start: 2015-01-03 — End: 2015-01-05

## 2015-01-03 MED ORDER — BENZOCAINE-MENTHOL 20-0.5 % EX AERO
1.0000 "application " | INHALATION_SPRAY | CUTANEOUS | Status: DC | PRN
  Administered 2015-01-03: 1 via TOPICAL
  Filled 2015-01-03 (×2): qty 56

## 2015-01-03 NOTE — H&P (Signed)
Stacey Estrada is a 39 y.o. female, G2P1001 at 38.5 weeks, presenting for contractions.  Patient states she started contracting about 8 hours ago.  Patient desires epidural.  GBS negative.  No issues during pregnancy.  Patient is rH negative, but husband is also rH negative.     Patient Active Problem List   Diagnosis Date Noted  . Acute mastitis of left breast 05/02/2013  . NSVD (normal spontaneous vaginal delivery) 02/20/2013  . Second degree laceration of perineum, delivered, current hospitalization 02/20/2013  . Obstetric labial laceration, delivered, current hospitalization 02/20/2013  . Chest pain during pregnancy 01/30/2013  . Asthma 10/23/2012  . Pregnant state, incidental 09/16/2012  . Type b blood, rh negative 08/22/2012  . History of anxiety 08/22/2012  . History of anemia 08/22/2012  . Family history of cleft lip 08/22/2012    History of present pregnancy: Patient entered care at 10.1 weeks.   EDC of 01/12/2015 was established by Definite LMP on 04/07/2014.   Anatomy scan:  19.6 weeks, with normal findings and an posterior placenta.   Additional US evaluations:   -Anatomy: single, vertex, posterior fundal, placenta edge is 4.9 cm from internal OS normal. cx closed, fluid normal vertical pocket 5.4 cm, female. Placental cord insertion appears marginal 1.7 cm from placental edge -Growth at 31.2wks: 89%ile, Breech -38.3wks: EFW 8B 3 OZ (77TH%), CEPHALIC. NL AFI. POSTERIOR PLACENTA.  Significant prenatal events:  Started on Ativan at 29 wks for feelings of stress.  Reported groin pain that was managed with tylenol and warm compresses at 31wks.    Last evaluation:  01/01/15 at 38.3wks by Dr. Carmela HurtE. Kulwa. VE Deferred. FHR 145, BP 90/62, Wt 172lbs  OB History    Gravida Para Term Preterm AB TAB SAB Ectopic Multiple Living   2 1 1  0 0 0 0 0 0 1     Past Medical History  Diagnosis Date  . Ovarian cyst   . Anemia     low iron;iron supplements in past, however stomach upset  . Asthma      Triggered by streunous exercise, anxiety and heat;inhaler prn  . Infection     UTI;hx of frequent UTI;last UTI 2012 x 1  . Anxiety     No meds  . Ruptured ovarian cyst     Around late teens/early 20's  . Cyst (solitary) of breast 2013    GYN felt a  cyst in rt breast;upper area and lower area  . NSVD (normal spontaneous vaginal delivery) 02/20/2013  . Type b blood, rh negative 08/22/2012   Past Surgical History  Procedure Laterality Date  . Wisdom tooth extraction     Family History: family history includes Anemia in her paternal grandmother; Asthma in her mother; Cancer in her maternal grandmother; Dementia in her maternal grandmother; Hypertension in her paternal grandmother; Migraines in her paternal grandmother; Scoliosis in her brother. Social History:  reports that she quit smoking about 2 years ago. Her smoking use included Cigarettes. She has never used smokeless tobacco. She reports that she does not drink alcohol or use illicit drugs.   Prenatal Transfer Tool  Maternal Diabetes: No Genetic Screening: Declined Maternal Ultrasounds/Referrals: Normal Fetal Ultrasounds or other Referrals:  None Maternal Substance Abuse:  No Significant Maternal Medications:  Meds include: Other: Ativan Significant Maternal Lab Results: Lab values include: Group B Strep negative    ROS:  +Ctx x 7 hrs, +FM, -VB, -LOF  No Known Allergies   Dilation: 8 Effacement (%): 90, 100 Station: -2 Exam by::  SBeck, RN currently breastfeeding.  FHR: 150s by doppler UCs:  Q1-70min, palpates moderate  Prenatal labs: ABO, Rh:  B Negative Antibody:  Negative Rubella:   Immune RPR:   NR HBsAg:   Negative HIV:   Negative GBS:  Negative Sickle cell/Hgb electrophoresis:  N/A Pap: Abnormal 06/17/14, Needs PP Repeat GC:  Negative Chlamydia:  Negative Genetic screenings:  Declined Glucola:  Normal Other:  None    Assessment IUP at 38.5wks Active Labor at Term GBS Negative RH  Negative  Plan: Admit to [facility] per consult with Dr. Delrae Sawyers Routine Labor and Delivery Orders per [facility] Protocol Anticipate SVD  Gerrit Heck [facility], MSN 01/03/2015, 8:49 AM

## 2015-01-03 NOTE — Progress Notes (Signed)
NSVD at [phone]

## 2015-01-03 NOTE — Lactation Note (Signed)
This note was copied from the chart of Stacey Windell [name]. Lactation Consultation Note  Initial visit made.  Breastfeeding consultation services and support information given and reviewed with mom.  She states she breastfed her daughter for 17 months.  Mom states newborn has been feeding well.  Instructed on feeding cues and encouraged to feed baby with cues.  Instructed to call for concerns/assist prn.  Patient Name: Stacey Estrada [id number]'s Date: 01/03/2015 Reason for consult: Initial assessment   Maternal Data Does the patient have breastfeeding experience prior to this delivery?: Yes  Feeding Feeding Type: Breast Fed Length of feed: 45 min  LATCH Score/Interventions                      Lactation Tools Discussed/Used     Consult Status Consult Status: Follow-up Date: 01/04/15 Follow-up type: In-patient    Huston FoleyMOULDEN, Asahel Risden S 01/03/2015, 3:29 PM

## 2015-01-03 NOTE — MAU Note (Signed)
Pt presents to MAU with complaints of contractions since early this am. Reports bloody show.

## 2015-01-04 LAB — ABO/RH: ABO/RH(D): B NEG

## 2015-01-04 LAB — RPR: RPR Ser Ql: NONREACTIVE

## 2015-01-04 LAB — CBC
HCT: 34.3 % — ABNORMAL LOW (ref 36.0–46.0)
Hemoglobin: 11 g/dL — ABNORMAL LOW (ref 12.0–15.0)
MCH: 26 pg (ref 26.0–34.0)
MCHC: 32.1 g/dL (ref 30.0–36.0)
MCV: 81.1 fL (ref 78.0–100.0)
Platelets: 168 10*3/uL (ref 150–400)
RBC: 4.23 MIL/uL (ref 3.87–5.11)
RDW: 21.2 % — ABNORMAL HIGH (ref 11.5–15.5)
WBC: 10.5 10*3/uL (ref 4.0–10.5)

## 2015-01-04 NOTE — Lactation Note (Signed)
This note was copied from the chart of Stacey Estrada Eudy. Lactation Consultation Note  Patient Name: Stacey Estrada Breck ZOXWR'UToday's Date: 01/04/2015   Followed-up with RN at 7238 hours old.  RN stated infant is breastfeeding well.  Infant has breastfed x10 in past 24 hours with LS-10 by RN; voids-5; stools-3. Consult Status  Follow up on 01/05/15    Lendon KaVann, Titan Karner Walker 01/04/2015, 11:19 PM

## 2015-01-04 NOTE — Progress Notes (Signed)
Stacey NorfolkGenevieve Estrada   Subjective: Post Partum Day 1 Vaginal delivery, 2 degree laceration Patient up ad lib, denies syncope or dizziness. Reports consuming regular diet without issues and denies N/V No issues with urination and reports bleeding is appropriate  Feeding:  breast Contraceptive plan:   condoms  Objective: Temp:  [97.5 F (36.4 C)-97.9 F (36.6 C)] 97.9 F (36.6 C) (02/01 0620) Pulse Rate:  [76-96] 78 (02/01 0620) Resp:  [16-18] 16 (02/01 0620) BP: (87-142)/(66-89) 116/72 mmHg (02/01 [id number])  Physical Exam:  General: alert and cooperative Ext: WNL, no edema. No evidence of DVT seen on physical exam. Breast: Soft filling Lungs: CTAB Heart RRR without murmur  Abdomen:  Soft, fundus firm, lochia scant, + bowel sounds, non distended, non tender Lochia: appropriate Uterine Fundus: firm Laceration: healing well    Recent Labs  01/04/15 0545  HGB 11.0*  HCT 34.3*    Assessment S/P Vaginal Delivery-Day 1 Stable  Normal Involution Breastfeeding Circumcision: in patient.  Peds report the baby had hydrocele   Plan: Continue current care Dr. Sallye OberKulwa updated on patient status  Plan for discharge tomorrow and Breastfeeding Lactation support   Stacey Estrada, CNM, MSN 01/04/2015, 9:46 AM

## 2015-01-05 MED ORDER — IBUPROFEN 600 MG PO TABS: 600.0000 mg | ORAL_TABLET | Freq: Four times a day (QID) | ORAL | Status: DC | PRN

## 2015-01-05 MED ORDER — OXYCODONE-ACETAMINOPHEN 5-325 MG PO TABS: 1.0000 | ORAL_TABLET | ORAL | Status: DC | PRN

## 2015-01-05 NOTE — Lactation Note (Signed)
This note was copied from the chart of Stacey Estrada Jian. Lactation Consultation Note Mom states BF going great. Denies soreness to nipples or problems latching. Experienced BF mom of 17 months with her first child. L10. Good out put. 7% weight loss. Reminded to stay hydrated, rest, and call LC OP services if needed.  Patient Name: Stacey Estrada Metoyer [id number]'NToday's Date: 01/05/2015 Reason for consult: Follow-up assessment   Maternal Data    Feeding    LATCH Score/Interventions                      Lactation Tools Discussed/Used     Consult Status Consult Status: Complete Date: 01/05/15 Follow-up type: Call as needed    Charyl DancerCARVER, Deontrey Massi G 01/05/2015, 9:39 AM

## 2015-01-05 NOTE — Discharge Instructions (Signed)

## 2015-01-05 NOTE — Discharge Summary (Signed)
Vaginal Delivery Discharge Summary  Stacey Estrada  DOB:    Apr 05, 1976 MRN:    [id number] CSN:    [id number]  Date of admission:           01/03/15         Date of discharge:                   01/05/15  Procedures this admission:   SVB, repair of 2nd degree laceration.  Date of Delivery: 01/03/15  Newborn Data:  Live born female  Birth Weight: 7 lb 9.5 oz (3445 g) APGAR: 9, 9  Home with mother. Name: Stacey Estrada Circumcision Plan: Inpatient  History of Present Illness:  [name] is a 39 y.o. female, G2P2002, who presents at [redacted]w[redacted]d weeks gestation. The patient has been followed at the [facility] and Gynecology division of [facility]. She was admitted onset of labor. Her pregnancy has been complicated by:  Patient Active Problem List   Diagnosis Date Noted  . Active labor at term 01/03/2015  . SVD (spontaneous vaginal delivery) 01/03/2015  . Second-degree perineal laceration, with delivery 01/03/2015  . Blood type, Rh negative 01/03/2015  . Acute mastitis of left breast 05/02/2013  . NSVD (normal spontaneous vaginal delivery) 02/20/2013  . Second degree laceration of perineum, delivered, current hospitalization 02/20/2013  . Obstetric labial laceration, delivered, current hospitalization 02/20/2013  . Chest pain during pregnancy 01/30/2013  . Asthma 10/23/2012  . Pregnant state, incidental 09/16/2012  . Type b blood, rh negative 08/22/2012  . History of anxiety 08/22/2012  . History of anemia 08/22/2012  . Family history of cleft lip 08/22/2012     Hospital Course:  Admitted 01/03/15 in active labor. Negative GBS. Progressed  rapidly. Utilized nothing for pain management.  Delivery was performed by Gerrit Heck, CNM, without complication. Patient and baby tolerated the procedure without difficulty, with  2nd degree perineal laceration noted. Infant status was stable and remained in room with mother.  Mother and infant then had an  uncomplicated postpartum course, with breast feeding going well. Mom's physical exam was WNL, and she was discharged home in stable condition. Contraception plan was condoms.  She received adequate benefit from po pain medications, using Motrin and Percocet.   Feeding:  breast  Contraception:  condoms  Discharge hemoglobin:  HEMOGLOBIN  Date Value Ref Range Status  01/04/2015 11.0* 12.0 - 15.0 g/dL Final   HCT  Date Value Ref Range Status  01/04/2015 34.3* 36.0 - 46.0 % Final    Discharge Physical Exam:   General: alert Lochia: appropriate Uterine Fundus: firm Incision: healing well DVT Evaluation: No evidence of DVT seen on physical exam. Negative Homan's sign.  Intrapartum Procedures: spontaneous vaginal delivery Postpartum Procedures: none Complications-Operative and Postpartum: 2nd degree perineal laceration  Discharge Diagnoses: Term Pregnancy-delivered  Discharge Information:  Activity:           pelvic rest Diet:                routine Medications: Ibuprofen and Percocet Condition:      stable Instructions:     Discharge to: home  Follow-up Information    Follow up with [facility]. Schedule an appointment as soon as possible for a visit in 6 weeks.   Specialty:  Obstetrics and Gynecology   Why:  Call for any questions or concerns.   Contact information:   [address] [place] [address] [phone]  Plan for f/u pap at 6 week visit.   Nigel BridgemanLATHAM, [place] CNM 01/05/2015 11:06 AM

## 2018-05-07 ENCOUNTER — Encounter: Payer: Self-pay | Admitting: Neurology

## 2018-06-05 ENCOUNTER — Institutional Professional Consult (permissible substitution): Payer: Self-pay | Admitting: Neurology

## 2018-09-05 ENCOUNTER — Ambulatory Visit (INDEPENDENT_AMBULATORY_CARE_PROVIDER_SITE_OTHER): Payer: BLUE CROSS/BLUE SHIELD | Admitting: Neurology

## 2018-09-05 ENCOUNTER — Encounter: Payer: Self-pay | Admitting: Neurology

## 2018-09-05 VITALS — BP 105/64 | HR 67 | Ht 65.0 in | Wt 160.0 lb

## 2018-09-05 DIAGNOSIS — R0683 Snoring: Secondary | ICD-10-CM | POA: Diagnosis not present

## 2018-09-05 DIAGNOSIS — R0681 Apnea, not elsewhere classified: Secondary | ICD-10-CM | POA: Diagnosis not present

## 2018-09-05 DIAGNOSIS — J45990 Exercise induced bronchospasm: Secondary | ICD-10-CM

## 2018-09-05 DIAGNOSIS — G478 Other sleep disorders: Secondary | ICD-10-CM

## 2018-09-05 DIAGNOSIS — E663 Overweight: Secondary | ICD-10-CM | POA: Diagnosis not present

## 2018-09-05 DIAGNOSIS — G4719 Other hypersomnia: Secondary | ICD-10-CM

## 2018-09-05 NOTE — Patient Instructions (Signed)

## 2018-09-05 NOTE — Progress Notes (Signed)
Subjective:    Patient ID: Stacey Estrada is a 42 y.o. female.  HPI     Huston Foley, MD, PhD [facility] [address], [address] [address] [place], [place] [address]  Dear [place],   I saw your patient, Stacey Estrada, upon your kind request, in my sleep clinic today for initial consultation of her sleep disorder, in particular, concern for underlying obstructive sleep apnea. The patient is unaccompanied today. As you know, Stacey Estrada with an underlying medical history of depression and overweight state, who reports snoring and excessive daytime somnolence, as well as witnessed apneas per husband's report. I reviewed your office note from 02/21/2018, which you kindly included. Her Epworth sleepiness score is 12 out of 24 today, fatigue score is 49 out of 63. She is married and lives with her husband and her 2 children. She smokes about half a pack per day. She is trying to quit smoking. She drinks alcohol infrequently, once or twice a month or so. She drinks caffeine in the form of tea, one serving per day on average. She has multiple nighttime awakenings. She does not wake up rested. She has woken up with a sense of choking. Her father has sleep apnea and she believes that her mother also was recently diagnosed with sleep apnea. 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 She has a 39-year-old and her 42-year-old son. 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 She tries to keep good sleep hygiene and a conducive sleep environment, no longer has a TV in her bedroom. She has a bedtime of around 10 and rise time of around 6. She falls asleep okay but has difficulty maintaining sleep. She is trying to lose weight. She is trying to quit smoking. She has lost 10 pounds in the recent past. She denies telltale symptoms of restless leg syndrome. She does not suffer from recurrent morning headaches or night  to night nocturia.no pets in the bedroom.  Her Past Medical History Is Significant For: Past Medical History:  Diagnosis Date  . Anemia    low iron;iron supplements in past, however stomach upset  . Anxiety    No meds  . Asthma    Triggered by streunous exercise, anxiety and heat;inhaler prn  . Cyst (solitary) of breast 2013   GYN felt a  cyst in rt breast;upper area and lower area  . Infection    UTI;hx of frequent UTI;last UTI 2012 x 1  . NSVD (normal spontaneous vaginal delivery) 02/20/2013  . Ovarian cyst   . Ruptured ovarian cyst    Around late teens/early 20's  . Type B blood, Rh negative 08/22/2012    Her Past Surgical History Is Significant For: Past Surgical History:  Procedure Laterality Date  . WISDOM TOOTH EXTRACTION      Her Family History Is Significant For: Family History  Problem Relation Age of Onset  . Hypertension Paternal Grandmother   . Migraines Paternal Grandmother   . Anemia Paternal Grandmother   . Cancer Maternal Grandmother        lymphoma   . Dementia Maternal Grandmother   . Asthma Mother   . Scoliosis Brother     Her Social History Is Significant For: Social History   Socioeconomic History  . Marital status: Married    Spouse name: Quandra Fedorchak  . Number of children: 0  . Years of education: 16  . Highest education level:  Not on file  Occupational History  . 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 Financial resource strain: Not on file  . Food insecurity:    Worry: Not on file    Inability: Not on file  . Transportation needs:    Medical: Not on file    Non-medical: Not on file  Tobacco Use  . Smoking status: Former Smoker    Types: Cigarettes    Last attempt to quit: 06/12/2012    Years since quitting: 6.2  . Smokeless tobacco: Never Used  Substance and Sexual Activity  . Alcohol use: No    Comment: occasionally;once/month  . Drug use: No  . Sexual activity: Not Currently    Partners: Male     Birth control/protection: Pill, Injection, Inserts, None, Condom  Lifestyle  . Physical activity:    Days per week: Not on file    Minutes per session: Not on file  . Stress: Not on file  Relationships  . Social connections:    Talks on phone: Not on file    Gets together: Not on file    Attends religious service: Not on file    Active member of club or organization: Not on file    Attends meetings of clubs or organizations: Not on file    Relationship status: Not on file  Other Topics Concern  . Not on file  Social History Narrative  . Not on file    Her Allergies Are:  No Known Allergies:   Her Current Medications Are:  Outpatient Encounter Medications as of 09/05/2018  Medication Sig  . albuterol (PROVENTIL HFA;VENTOLIN HFA) 108 (90 BASE) MCG/ACT inhaler Inhale 2 puffs into the lungs every 6 (six) hours as needed for wheezing.  . sertraline (ZOLOFT) 100 MG tablet Take 100 mg by mouth daily.  . [DISCONTINUED] acetaminophen (TYLENOL) 500 MG tablet Take 2 tablets (1,000 mg total) by mouth every 6 (six) hours.  . [DISCONTINUED] ferrous sulfate 325 (65 FE) MG tablet Take 325 mg by mouth daily with breakfast.  . [DISCONTINUED] ibuprofen (ADVIL,MOTRIN) 600 MG tablet Take 1 tablet (600 mg total) by mouth every 6 (six) hours as needed.  . [DISCONTINUED] oxyCODONE-acetaminophen (PERCOCET/ROXICET) 5-325 MG per tablet Take 1 tablet by mouth every 4 (four) hours as needed (for pain scale less than 7).  . [DISCONTINUED] pantoprazole (PROTONIX) 40 MG tablet Take 40 mg by mouth daily.  . [DISCONTINUED] Prenatal Vit-Fe Fumarate-FA (PRENATAL MULTIVITAMIN) TABS tablet Take 1 tablet by mouth daily at 12 noon.  . [DISCONTINUED] sertraline (ZOLOFT) 50 MG tablet Take 50 mg by mouth daily.   No facility-administered encounter medications on file as of 09/05/2018.   :  Review of Systems:  Out of a complete 14 point review of systems, all are reviewed and negative with the exception of these symptoms  as listed below: Review of Systems  Neurological:       Patient reports having difficulty sleeping through the night, daytime sleepiness, and snoring.   Epworth Sleepiness Scale 0= would never doze 1= slight chance of dozing 2= moderate chance of dozing 3= high chance of dozing  Sitting and reading:3 Watching TV:2 Sitting inactive in a public place (ex. Theater or meeting):1 As a passenger in a car for an hour without a break:1 Lying down to rest in the afternoon:3 Sitting and talking to someone:0 Sitting quietly after lunch (no alcohol):2 In a car, while stopped in traffic:0 Total:12     Objective:  Neurological  Exam  Physical Exam Physical Examination:   Vitals:   09/05/18 1519  BP: 105/64  Pulse: 67    General Examination: The patient is a very pleasant 42 y.o. female in no acute distress. She appears well-developed and well-nourished and well groomed.   HEENT: Normocephalic, atraumatic, pupils are equal, round and reactive to light and accommodation. Extraocular tracking is good without limitation to gaze excursion or nystagmus noted. Normal smooth pursuit is noted. Hearing is grossly intact. Face is symmetric with normal facial animation and normal facial sensation. Speech is clear with no dysarthria noted. There is no hypophonia. There is no lip, neck/head, jaw or voice tremor. Neck is supple with full range of passive and active motion. There are no carotid bruits on auscultation. Oropharynx exam reveals: mild mouth dryness, good dental hygiene and mild airway crowding, due to smaller airway entry. Mallampati is class I. Tongue protrudes centrally and palate elevates symmetrically. Tonsils are 1+ in size. Neck size is 13.75 inches. She has a Moderate overbite and mild retrognathia.  Chest: Clear to auscultation without wheezing, rhonchi or crackles noted.  Heart: S1+S2+0, regular and normal without murmurs, rubs or gallops noted.   Abdomen: Soft, non-tender and  non-distended with normal bowel sounds appreciated on auscultation.  Extremities: There is no pitting edema in the distal lower extremities bilaterally. Pedal pulses are intact.  Skin: Warm and dry without trophic changes noted. There are no varicose veins.  Musculoskeletal: exam reveals no obvious joint deformities, tenderness or joint swelling or erythema.   Neurologically:  Mental status: The patient is awake, alert and oriented in all 4 spheres. Her immediate and remote memory, attention, language skills and fund of knowledge are appropriate. There is no evidence of aphasia, agnosia, apraxia or anomia. Speech is clear with normal prosody and enunciation. Thought process is linear. Mood is normal and affect is normal.  Cranial nerves II - XII are as described above under HEENT exam. In addition: shoulder shrug is normal with equal shoulder height noted.  Motor exam: Normal bulk, strength and tone is noted. There is no drift, tremor or rebound. Romberg is negative. Reflexes are 2+ throughout. Fine motor skills and coordination: grossly intact.  Cerebellar testing: No dysmetria or intention tremor. There is no truncal or gait ataxia.  Sensory exam: intact to light touch in the upper and lower extremities.  Gait, station and balance: She stands easily. No veering to one side is noted. No leaning to one side is noted. Posture is age-appropriate and stance is narrow based. Gait shows normal stride length and normal pace. No problems turning are noted. Tandem walk is unremarkable.   Assessment and plan:   In summary, Tenelle Andreason is a very pleasant 42 y.o.-year old female with an underlying medical history of depression and overweight state, whose history and physical exam are concerning for obstructive sleep apnea (OSA). I had a long chat with the patient about my findings and the diagnosis of OSA, its prognosis and treatment options. We talked about medical treatments, surgical interventions and  non-pharmacological approaches. I explained in particular the risks and ramifications of untreated moderate to severe OSA, especially with respect to developing cardiovascular disease down the Road, including congestive heart failure, difficult to treat hypertension, cardiac arrhythmias, or stroke. Even type 2 diabetes has, in part, been linked to untreated OSA. Symptoms of untreated OSA include daytime sleepiness, memory problems, mood irritability and mood disorder such as depression and anxiety, lack of energy, as well as recurrent headaches, especially morning  headaches. We talked about smoking cessation and trying to maintain a  healthy lifestyle in general, as well as the importance of weight control. I encouraged the patient to eat healthy, exercise daily and keep well hydrated, to keep a scheduled bedtime and wake time routine, to not skip any meals and eat healthy snacks in between meals. I advised the patient not to drive when feeling sleepy. I recommended the following at this time: sleep study with potential positive airway pressure titration. (We will score hypopneas at 3%).   I explained the sleep test procedure to the patient and also outlined possible surgical and non-surgical treatment options of OSA, including the use of a custom-made dental device (which would require a referral to a specialist dentist or oral surgeon), upper airway surgical options, such as pillar implants, radiofrequency surgery, tongue base surgery, and UPPP (which would involve a referral to an ENT surgeon). Rarely, jaw surgery such as mandibular advancement may be considered.  I also explained the CPAP treatment option to the patient, who indicated that she would be willing to try CPAP if the need arises. I explained the importance of being compliant with PAP treatment, not only for insurance purposes but primarily to improve Her symptoms, and for the patient's long term health benefit, including to reduce Her  cardiovascular risks. I answered all her questions today and the patient was in agreement. I plan to see her back after the sleep study is completed and encouraged her to call with any interim questions, concerns, problems or updates.   Thank you very much for allowing me to participate in the care of this nice patient. If I can be of any further assistance to you please do not hesitate to call me at [phone].  Sincerely,   Huston Foley, MD, PhD

## 2018-09-09 ENCOUNTER — Telehealth: Payer: Self-pay

## 2018-09-09 DIAGNOSIS — G4719 Other hypersomnia: Secondary | ICD-10-CM

## 2018-09-09 NOTE — Telephone Encounter (Signed)
VO for HST from Dr. Athar received. HST order placed.  

## 2018-09-09 NOTE — Telephone Encounter (Signed)
[employer] denied in lab sleep study, Need HST order 

## 2018-09-19 ENCOUNTER — Telehealth: Payer: Self-pay | Admitting: Neurology

## 2018-09-19 NOTE — Telephone Encounter (Signed)
Pt was called back and message was left on voicemail that she would need to speak with the sleep center for an update on her Sleep study.  [facility]'s phone number was left on pt's voicemail so pt could be connected to sleep center for an update

## 2018-09-19 NOTE — Telephone Encounter (Signed)
Pt is asking for a call to know if she has been approved through her insurance for a sleep study.  Pt is asking to be called when this information is available

## 2018-10-09 ENCOUNTER — Ambulatory Visit: Payer: BLUE CROSS/BLUE SHIELD | Admitting: Neurology

## 2018-10-09 DIAGNOSIS — G4710 Hypersomnia, unspecified: Secondary | ICD-10-CM

## 2018-10-09 DIAGNOSIS — G4719 Other hypersomnia: Secondary | ICD-10-CM

## 2018-10-14 ENCOUNTER — Telehealth: Payer: Self-pay

## 2018-10-14 NOTE — Telephone Encounter (Signed)
-----   Message from Huston Foley, MD sent at 10/14/2018  8:12 AM EST ----- Patient referred by Dr. Renne Crigler and his NP, seen by me on 09/05/18, HST on 10/09/18.    Please call and notify the patient that the recent home sleep test did not show any significant obstructive sleep apnea.  Please remind patient to try to maintain good sleep hygiene, which means: Keep a regular sleep and wake schedule and make enough time for sleep (7 1/2 to 8 1/2 hours for the average adult), try not to exercise or have a meal within 2 hours of your bedtime, try to keep your bedroom conducive for sleep, that is, cool and dark, without light distractors such as an illuminated alarm clock, and refrain from watching TV right before sleep or in the middle of the night and do not keep the TV or radio on during the night. If a nightlight is used, have it away from the visual field. Also, try not to use or play on electronic devices at bedtime, such as your cell phone, tablet PC or laptop. If you like to read at bedtime on an electronic device, try to dim the background light as much as possible. Do not eat in the middle of the night. Keep pets away from the bedroom environment. For stress relief, try meditation, deep breathing exercises (there are many books and CDs available), a white noise machine or fan can help to diffuse other noise distractors, such as traffic noise. Do not drink alcohol before bedtime, as it can disturb sleep and cause middle of the night awakenings. Never mix alcohol and sedating medications! Avoid narcotic pain medication close to bedtime, as opioids/narcotics can suppress breathing drive and breathing effort.   Patient can follow up with the referring provider.   Huston Foley, MD, PhD [facility])

## 2018-10-14 NOTE — Telephone Encounter (Signed)
I called pt to discuss. No answer, left a message asking her to call me back. 

## 2018-10-14 NOTE — Progress Notes (Signed)
Patient referred by Dr. Renne Crigler and his NP, seen by me on 09/05/18, HST on 10/09/18.    Please call and notify the patient that the recent home sleep test did not show any significant obstructive sleep apnea.  Please remind patient to try to maintain good sleep hygiene, which means: Keep a regular sleep and wake schedule and make enough time for sleep (7 1/2 to 8 1/2 hours for the average adult), try not to exercise or have a meal within 2 hours of your bedtime, try to keep your bedroom conducive for sleep, that is, cool and dark, without light distractors such as an illuminated alarm clock, and refrain from watching TV right before sleep or in the middle of the night and do not keep the TV or radio on during the night. If a nightlight is used, have it away from the visual field. Also, try not to use or play on electronic devices at bedtime, such as your cell phone, tablet PC or laptop. If you like to read at bedtime on an electronic device, try to dim the background light as much as possible. Do not eat in the middle of the night. Keep pets away from the bedroom environment. For stress relief, try meditation, deep breathing exercises (there are many books and CDs available), a white noise machine or fan can help to diffuse other noise distractors, such as traffic noise. Do not drink alcohol before bedtime, as it can disturb sleep and cause middle of the night awakenings. Never mix alcohol and sedating medications! Avoid narcotic pain medication close to bedtime, as opioids/narcotics can suppress breathing drive and breathing effort.   Patient can follow up with the referring provider.   Huston Foley, MD, PhD [facility])

## 2018-10-14 NOTE — Procedures (Signed)
[facility] [address] [place], [place] [address] NAME:   Stacey Estrada                                                    DOB: 1976-07-31 MEDICAL RECORD NUMBER [id number]                                         DOS:  10/09/18 REFERRING PHYSICIAN: Dr. Merri Brunette STUDY PERFORMED: Home Sleep Test HISTORY: 42 year old woman with a history of depression and overweight state, who reports snoring and excessive daytime somnolence, as well as witnessed apneas. Her Epworth sleepiness score is 12 out of 24, BMI of 26.6.   STUDY RESULTS:  Total Recording Time:  8 hours, 53 minutes Total Apnea/Hypopnea Index (AHI): 2.1/h,  RDI: 6.8  /h Average Oxygen Saturation:    95%, Lowest Oxygen Desaturation: 89%  Total Time Oxygen Saturation Below or at 88%: 0 minutes  Average Heart Rate: 79 bpm (between 67 and 122 bpm) IMPRESSION: Excessive daytime sleepiness RECOMMENDATION: This home sleep test does not demonstrate any significant obstructive or central sleep disordered breathing. Mild intermittent snoring was noted. Other causes of the patient's symptoms, including circadian rhythm disturbances, an underlying mood disorder, medication effect and/or an underlying medical problem cannot be ruled out based on this test. Clinical correlation is recommended. The patient should be cautioned not to drive, work at heights, or operate dangerous or heavy equipment when tired or sleepy. Review and reiteration of good sleep hygiene measures should be pursued with any patient. The patient can follow up with her referring provider, who will be notified of the test results.  I certify that I have reviewed the raw data recording prior to the issuance of this report in accordance with the standards of the American Academy of Sleep Medicine (AASM).   Huston Foley, MD, PhD [facility]) Diplomat, ABPN (Neurology and Sleep)

## 2018-10-16 NOTE — Telephone Encounter (Signed)
I called pt again to discuss her sleep study results, no answer, left a message asking her to call me back. 

## 2018-10-17 NOTE — Telephone Encounter (Signed)
I called pt again to discuss her sleep study results. No answer, left a message asking her to call me back. 

## 2018-10-18 NOTE — Telephone Encounter (Signed)
I called pt again, no answer, left a detailed message on her VM with her sleep study results. I asked her to call us back if she has any questions or concerns. I have sent a copy of her sleep study to Dr. Carolee RotaPharr's office.

## 2020-03-18 ENCOUNTER — Ambulatory Visit: Payer: BC Managed Care – PPO | Attending: Internal Medicine

## 2020-03-18 ENCOUNTER — Ambulatory Visit: Payer: Self-pay

## 2020-03-18 DIAGNOSIS — Z23 Encounter for immunization: Secondary | ICD-10-CM

## 2020-03-18 NOTE — Progress Notes (Signed)
   Covid-19 Vaccination Clinic  Name:  Stacey Estrada    MRN: [id number] DOB: [birth year]  03/18/2020  Stacey Estrada was observed post Covid-19 immunization for 15 minutes without incident. She was provided with Vaccine Information Sheet and instruction to access the V-Safe system.   Stacey Estrada was instructed to call 911 with any severe reactions post vaccine: Marland Kitchen Difficulty breathing  . Swelling of face and throat  . A fast heartbeat  . A bad rash all over body  . Dizziness and weakness   Immunizations Administered    Name Date Dose VIS Date Route   Pfizer COVID-19 Vaccine 03/18/2020  8:34 AM 0.3 mL 11/14/2019 Intramuscular   Manufacturer: [employer], [employer]   Lot: [id number]   NDC: [address]-9

## 2020-04-12 ENCOUNTER — Ambulatory Visit: Payer: BC Managed Care – PPO | Attending: Internal Medicine

## 2020-04-12 DIAGNOSIS — Z23 Encounter for immunization: Secondary | ICD-10-CM

## 2020-04-12 NOTE — Progress Notes (Signed)
   Covid-19 Vaccination Clinic  Name:  Stacey Estrada    MRN: [id number] DOB: 1976-10-22  04/12/2020  [name] was observed post Covid-19 immunization for 15 minutes without incident. She was provided with Vaccine Information Sheet and instruction to access the V-Safe system.   [name] was instructed to call 911 with any severe reactions post vaccine: Marland Kitchen Difficulty breathing  . Swelling of face and throat  . A fast heartbeat  . A bad rash all over body  . Dizziness and weakness   Immunizations Administered    Name Date Dose VIS Date Route   Pfizer COVID-19 Vaccine 04/12/2020  8:28 AM 0.3 mL 01/28/2019 Intramuscular   Manufacturer: [employer], [employer]   Lot: [id number]   NDC: [address]-6

## 2021-03-10 ENCOUNTER — Other Ambulatory Visit: Payer: Self-pay | Admitting: Internal Medicine

## 2021-03-10 ENCOUNTER — Inpatient Hospital Stay: Payer: BC Managed Care – PPO

## 2021-03-10 ENCOUNTER — Other Ambulatory Visit: Payer: Self-pay

## 2021-03-10 DIAGNOSIS — R002 Palpitations: Secondary | ICD-10-CM

## 2022-02-27 DIAGNOSIS — R5383 Other fatigue: Secondary | ICD-10-CM | POA: Diagnosis not present

## 2022-02-27 DIAGNOSIS — Z0000 Encounter for general adult medical examination without abnormal findings: Secondary | ICD-10-CM | POA: Diagnosis not present

## 2022-03-02 DIAGNOSIS — R519 Headache, unspecified: Secondary | ICD-10-CM | POA: Diagnosis not present

## 2022-03-02 DIAGNOSIS — Z0000 Encounter for general adult medical examination without abnormal findings: Secondary | ICD-10-CM | POA: Diagnosis not present

## 2022-10-18 DIAGNOSIS — R69 Illness, unspecified: Secondary | ICD-10-CM | POA: Diagnosis not present

## 2022-10-18 DIAGNOSIS — G44209 Tension-type headache, unspecified, not intractable: Secondary | ICD-10-CM | POA: Diagnosis not present

## 2022-10-24 ENCOUNTER — Encounter: Payer: Self-pay | Admitting: Neurology

## 2022-11-08 DIAGNOSIS — G44209 Tension-type headache, unspecified, not intractable: Secondary | ICD-10-CM | POA: Diagnosis not present

## 2022-11-08 DIAGNOSIS — R69 Illness, unspecified: Secondary | ICD-10-CM | POA: Diagnosis not present

## 2023-03-07 DIAGNOSIS — Z0000 Encounter for general adult medical examination without abnormal findings: Secondary | ICD-10-CM | POA: Diagnosis not present

## 2023-03-07 DIAGNOSIS — F419 Anxiety disorder, unspecified: Secondary | ICD-10-CM | POA: Diagnosis not present

## 2023-03-07 NOTE — Progress Notes (Signed)
NEUROLOGY CONSULTATION NOTE  Stacey Estrada MRN: [id number] DOB: 09/30/1977  Referring provider: Holland Commons, FNP Primary care provider: Holland Commons, FNP  Reason for consult:  headache  Assessment/Plan:   Chronic migraine without aura, without status migrainosus, not intractable Apneic spells in sleep, excessive daytime somnolence - query sleep apnea  Migraine prevention:  start topiramate 25mg  at bedtime.  We can increase to 50mg  at bedtime in 4 weeks.  Discussed side effects, including teratogenic effects (take precautions not to get pregnant) Migraine rescue:  rizatriptan 10mg .  Stop all OTC NSAIDs/analgesics Limit use of pain relievers to no more than 2 days out of week to prevent risk of rebound or medication-overuse headache. Keep headache diary Refer to sleep medicine for evaluation of sleep apnea Follow up 6 months.    Subjective:  Stacey Estrada is a 47 year old female with exercise-induced asthma who presents for headache.  History supplemented by referring provider's note.  Onset:  headaches off and on for many years but increased frequency and duration since 2023 Location:  bilateral retro-orbital/occipital radiating in band-like distribution Quality:  pressure Intensity:  6/10.   Aura:  absent Prodrome:  absent Associated symptoms:  sometimes nausea, hard to concentrate, bilateral/paraspinal neck pain, photophobia, phonophobia.  She denies associated visual disturbance, vomiting, numbness or weakness. Duration:  at least 2-3 days Frequency:  at least once a week Frequency of abortive medication: 4 to 5 days a week Triggers:  unknown Relieving factors:  sometimes sleeping Activity:  movement and routine daily activity aggravates it  Eye exam was unremarkable.  Past NSAIDS/analgesics:  ASA, naproxen Past abortive triptans:  none Past abortive ergotamine:  none Past muscle relaxants:  methocarbamol Past anti-emetic:  none Past  antihypertensive medications:  none Past antidepressant medications:  none Past anticonvulsant medications:  none Past anti-CGRP:  none Past vitamins/Herbal/Supplements:  none Past antihistamines/decongestants:  meclizine Other past therapies:  none  Current NSAIDS/analgesics:  BC powder, ibuprofen, acetaminophen Current triptans:  none Current ergotamine:  none Current anti-emetic:  none Current muscle relaxants:  cyclobenzaprine 5mg  (neck pain) Current Antihypertensive medications:  none Current Antidepressant medications:  sertraline 100mg  daily Current Anticonvulsant medications:  none Current anti-CGRP:  none Current Vitamins/Herbal/Supplements:  magnesium 400mg  daily, B12, D, fish oil Current Antihistamines/Decongestants:  Xyzal Other therapy:  none Birth control:  none    Caffeine:  Dr. Malachi Bonds daily.  Sweet tea.  No coffee Alcohol:  no Smoker:  yes cigarettes - 1 every 3-4 days Diet:  16 oz water daily.  Dr. Malachi Bonds.  Skips breakfast Exercise:  no Depression:  stable; Anxiety:  stable Other pain:  no Sleep hygiene:  Snores.  Husband says she chokes in her sleep.  Daytime fatigue.  She had a home sleep study in 2019 that did not reveal any significant obstructive sleep apnea. Family history of headache:  Paternal grandmother (migraines)      PAST MEDICAL HISTORY: Past Medical History:  Diagnosis Date   Anemia    low iron;iron supplements in past, however stomach upset   Anxiety    No meds   Asthma    Triggered by streunous exercise, anxiety and heat;inhaler prn   Cyst (solitary) of breast 2013   GYN felt a  cyst in rt breast;upper area and lower area   Infection    UTI;hx of frequent UTI;last UTI 2012 x 1   NSVD (normal spontaneous vaginal delivery) 02/20/2013   Ovarian cyst    Ruptured ovarian cyst    Around late teens/early  20's   Type B blood, Rh negative 08/22/2012    PAST SURGICAL HISTORY: Past Surgical History:  Procedure Laterality Date   WISDOM  TOOTH EXTRACTION      MEDICATIONS: Current Outpatient Medications on File Prior to Visit  Medication Sig Dispense Refill   albuterol (PROVENTIL HFA;VENTOLIN HFA) 108 (90 BASE) MCG/ACT inhaler Inhale 2 puffs into the lungs every 6 (six) hours as needed for wheezing. 1 Inhaler 2   sertraline (ZOLOFT) 100 MG tablet Take 100 mg by mouth daily.  3   No current facility-administered medications on file prior to visit.    ALLERGIES: No Known Allergies  FAMILY HISTORY: Family History  Problem Relation Age of Onset   Hypertension Paternal Grandmother    Migraines Paternal Grandmother    Anemia Paternal Grandmother    Cancer Maternal Grandmother        lymphoma    Dementia Maternal Grandmother    Asthma Mother    Scoliosis Brother     Objective:  Pulse 51, resp. rate 18, height 5\' 5"  (1.651 m), weight 200 lb (90.7 kg), SpO2 98 %, unknown if currently breastfeeding. General: No acute distress.  Patient appears well-groomed.   Head:  Normocephalic/atraumatic Eyes:  fundi examined but not visualized Neck: supple, right upper paraspinal tenderness, full range of motion Back: No paraspinal tenderness Heart: regular rate and rhythm Lungs: Clear to auscultation bilaterally. Vascular: No carotid bruits. Neurological Exam: Mental status: alert and oriented to person, place, and time, speech fluent and not dysarthric, language intact. Cranial nerves: CN I: not tested CN II: pupils equal, round and reactive to light, visual fields intact CN III, IV, VI:  full range of motion, no nystagmus, no ptosis CN V: facial sensation intact. CN VII: upper and lower face symmetric CN VIII: hearing intact CN IX, X: gag intact, uvula midline CN XI: sternocleidomastoid and trapezius muscles intact CN XII: tongue midline Bulk & Tone: normal, no fasciculations. Motor:  muscle strength 5/5 throughout Sensation:  Pinprick, temperature and vibratory sensation intact. Deep Tendon Reflexes:  2+ throughout,   toes downgoing.   Finger to nose testing:  Without dysmetria.   Heel to shin:  Without dysmetria.   Gait:  Normal station and stride.  Romberg negative.    Thank you for allowing me to take part in the care of this patient.  Metta Clines, DO  CC: Holland Commons, FNP

## 2023-03-08 ENCOUNTER — Ambulatory Visit: Payer: No Typology Code available for payment source | Admitting: Neurology

## 2023-03-08 ENCOUNTER — Encounter: Payer: Self-pay | Admitting: Neurology

## 2023-03-08 VITALS — BP 118/90 | HR 88 | Resp 18 | Ht 65.0 in | Wt 200.0 lb

## 2023-03-08 DIAGNOSIS — G43709 Chronic migraine without aura, not intractable, without status migrainosus: Secondary | ICD-10-CM | POA: Diagnosis not present

## 2023-03-08 DIAGNOSIS — R0683 Snoring: Secondary | ICD-10-CM | POA: Diagnosis not present

## 2023-03-08 DIAGNOSIS — G4719 Other hypersomnia: Secondary | ICD-10-CM

## 2023-03-08 MED ORDER — RIZATRIPTAN BENZOATE 10 MG PO TBDP: 10.0000 mg | ORAL_TABLET | ORAL | 11 refills | Status: DC | PRN

## 2023-03-08 MED ORDER — TOPIRAMATE 25 MG PO TABS: 25.0000 mg | ORAL_TABLET | Freq: Every day | ORAL | 5 refills | Status: DC

## 2023-03-08 NOTE — Patient Instructions (Signed)
  Start topiramate 25mg  at bedtime.  Contact us in 4 weeks with update and we can increase dose if needed. Stop all over the counter pain relievers Take rizatriptan 10mg  at earliest onset of headache.  May repeat dose once in 2 hours if needed.  Maximum 2 tablets in 24 hours. Limit use of pain relievers to no more than 2 days out of the week.  These medications include acetaminophen, NSAIDs (ibuprofen/Advil/Motrin, naproxen/Aleve, triptans (Imitrex/sumatriptan), Excedrin, and narcotics.  This will help reduce risk of rebound headaches. Routine exercise Stay adequately hydrated (aim for 64 oz water daily) Keep headache diary Maintain proper stress management Maintain proper sleep hygiene Do not skip meals Consider supplements:  Migrelief 2 pills daily Refer to [facility] for sleep study

## 2023-07-09 DIAGNOSIS — N951 Menopausal and female climacteric states: Secondary | ICD-10-CM | POA: Diagnosis not present

## 2023-07-09 DIAGNOSIS — Z124 Encounter for screening for malignant neoplasm of cervix: Secondary | ICD-10-CM | POA: Diagnosis not present

## 2023-07-09 DIAGNOSIS — Z1231 Encounter for screening mammogram for malignant neoplasm of breast: Secondary | ICD-10-CM | POA: Diagnosis not present

## 2023-07-09 DIAGNOSIS — Z1211 Encounter for screening for malignant neoplasm of colon: Secondary | ICD-10-CM | POA: Diagnosis not present

## 2023-07-09 DIAGNOSIS — Z01419 Encounter for gynecological examination (general) (routine) without abnormal findings: Secondary | ICD-10-CM | POA: Diagnosis not present

## 2023-07-09 DIAGNOSIS — Z139 Encounter for screening, unspecified: Secondary | ICD-10-CM | POA: Diagnosis not present

## 2023-07-09 DIAGNOSIS — Z6831 Body mass index (BMI) 31.0-31.9, adult: Secondary | ICD-10-CM | POA: Diagnosis not present

## 2023-07-09 DIAGNOSIS — Z3040 Encounter for surveillance of contraceptives, unspecified: Secondary | ICD-10-CM | POA: Diagnosis not present

## 2023-07-26 ENCOUNTER — Other Ambulatory Visit: Payer: Self-pay | Admitting: Obstetrics and Gynecology

## 2023-07-26 DIAGNOSIS — R928 Other abnormal and inconclusive findings on diagnostic imaging of breast: Secondary | ICD-10-CM

## 2023-08-08 ENCOUNTER — Ambulatory Visit
Admission: RE | Admit: 2023-08-08 | Discharge: 2023-08-08 | Disposition: A | Discharge status: Home / Self Care | Payer: No Typology Code available for payment source | Source: Ambulatory Visit | Attending: Obstetrics and Gynecology | Admitting: Obstetrics and Gynecology

## 2023-08-08 ENCOUNTER — Other Ambulatory Visit: Payer: Self-pay | Admitting: Obstetrics and Gynecology

## 2023-08-08 DIAGNOSIS — R928 Other abnormal and inconclusive findings on diagnostic imaging of breast: Secondary | ICD-10-CM

## 2023-08-08 DIAGNOSIS — N6315 Unspecified lump in the right breast, overlapping quadrants: Secondary | ICD-10-CM | POA: Diagnosis not present

## 2023-08-08 DIAGNOSIS — N6310 Unspecified lump in the right breast, unspecified quadrant: Secondary | ICD-10-CM

## 2023-08-14 ENCOUNTER — Ambulatory Visit
Admission: RE | Admit: 2023-08-14 | Discharge: 2023-08-14 | Disposition: A | Discharge status: Home / Self Care | Payer: No Typology Code available for payment source | Source: Ambulatory Visit | Attending: Obstetrics and Gynecology | Admitting: Obstetrics and Gynecology

## 2023-08-14 DIAGNOSIS — N6315 Unspecified lump in the right breast, overlapping quadrants: Secondary | ICD-10-CM | POA: Diagnosis not present

## 2023-08-14 DIAGNOSIS — N6310 Unspecified lump in the right breast, unspecified quadrant: Secondary | ICD-10-CM

## 2023-08-14 DIAGNOSIS — R928 Other abnormal and inconclusive findings on diagnostic imaging of breast: Secondary | ICD-10-CM

## 2023-08-14 HISTORY — PX: BREAST BIOPSY: SHX20

## 2023-08-22 DIAGNOSIS — R6882 Decreased libido: Secondary | ICD-10-CM | POA: Diagnosis not present

## 2023-08-22 DIAGNOSIS — Z79890 Hormone replacement therapy (postmenopausal): Secondary | ICD-10-CM | POA: Diagnosis not present

## 2023-08-22 DIAGNOSIS — N951 Menopausal and female climacteric states: Secondary | ICD-10-CM | POA: Diagnosis not present

## 2023-09-06 NOTE — Progress Notes (Signed)
NEUROLOGY FOLLOW UP OFFICE NOTE  Stacey Estrada [id number]  Assessment/Plan:   Migraine without aura, without status migrainosus, not intractable Neck pain Apneic spells in sleep, excessive daytime somnolence - query sleep apnea   Migraine prevention:  Increase topiramate to 50mg  at bedtime.  We can increase dose in 4 weeks if needed. Migraine rescue:  rizatriptan 10mg .  Limit use of pain relievers to no more than 2 days out of week to prevent risk of rebound or medication-overuse headache. Keep headache diary Refer to physical therapy for neck pain She plans to contact sleep medicine Follow up 6 months   Subjective:  Stacey Estrada is a 47 year old female with exercise-induced asthma who follows up for migraines.  UPDATE: Start topiramate Overall notes improvement.  Significant improvement at first but lately occurring more frequent for the last 2-3 months.  Intensity:  6/10 Duration:  starts to decrease in an hour with rizatriptan over 2 days Frequency:  once a week.  Referred to sleep medicine for re-evaluation of possible sleep apnea.  Hasn't made an appointment yet.  She has started menopause and is now on hormone therapy.  Frequency of abortive medication: 8 days a month Current NSAIDS/analgesics:  none Current triptans:  rizatriptan 10mg  Current ergotamine:  none Current anti-emetic:  none Current muscle relaxants:  cyclobenzaprine 5mg  (neck pain) Current Antihypertensive medications:  none Current Antidepressant medications:  sertraline 100mg  daily Current Anticonvulsant medications:  topiramate 25mg  at bedtime Current anti-CGRP:  none Current Vitamins/Herbal/Supplements:  magnesium 400mg  daily, B12, D, fish oil Current Antihistamines/Decongestants:  Xyzal Other therapy:  none Birth control/hormone:  estradiol, progesterone       Caffeine:  Dr. Reino Kent daily.  Sweet tea.  No coffee Alcohol:  no Smoker:  yes cigarettes - 1 every 3-4 days Diet:  16 oz  water daily.  Dr. Reino Kent.  Skips breakfast Exercise:  no Depression:  stable; Anxiety:  stable Other pain:  no Sleep hygiene:  Snores.  Husband says she chokes in her sleep.  Daytime fatigue.  She had a home sleep study in 2019 that did not reveal any significant obstructive sleep apnea.  HISTORY:  Onset:  headaches off and on for many years but increased frequency and duration since 2023 Location:  bilateral retro-orbital/occipital radiating in band-like distribution Quality:  pressure Intensity:  6/10.   Aura:  absent Prodrome:  absent Associated symptoms:  sometimes nausea, hard to concentrate, bilateral/paraspinal neck pain, photophobia, phonophobia.  She denies associated visual disturbance, vomiting, numbness or weakness. Duration:  at least 2-3 days Frequency:  at least once a week Frequency of abortive medication: 4 to 5 days a week Triggers:  unknown Relieving factors:  sometimes sleeping Activity:  movement and routine daily activity aggravates it   Eye exam was unremarkable.   Past NSAIDS/analgesics:  ASA, naproxen, BC powder, ibuprofen, acetaminophen Past abortive triptans:  none Past abortive ergotamine:  none Past muscle relaxants:  methocarbamol Past anti-emetic:  none Past antihypertensive medications:  none Past antidepressant medications:  none Past anticonvulsant medications:  none Past anti-CGRP:  none Past vitamins/Herbal/Supplements:  none Past antihistamines/decongestants:  meclizine Other past therapies:  none    Family history of headache:  Paternal grandmother (migraines)  PAST MEDICAL HISTORY: Past Medical History:  Diagnosis Date   Anemia    low iron;iron supplements in past, however stomach upset   Anxiety    No meds   Asthma    Triggered by streunous exercise, anxiety and heat;inhaler prn   Cyst (solitary) of breast  2013   GYN felt a  cyst in rt breast;upper area and lower area   Infection    UTI;hx of frequent UTI;last UTI 2012 x 1    NSVD (normal spontaneous vaginal delivery) 02/20/2013   Ovarian cyst    Ruptured ovarian cyst    Around late teens/early 20's   Type B blood, Rh negative 08/22/2012    MEDICATIONS: Current Outpatient Medications on File Prior to Visit  Medication Sig Dispense Refill   albuterol (PROVENTIL HFA;VENTOLIN HFA) 108 (90 BASE) MCG/ACT inhaler Inhale 2 puffs into the lungs every 6 (six) hours as needed for wheezing. (Patient not taking: Reported on 03/08/2023) 1 Inhaler 2   cyclobenzaprine (FLEXERIL) 5 MG tablet Take 5 mg by mouth daily as needed.     rizatriptan (MAXALT-MLT) 10 MG disintegrating tablet Take 1 tablet (10 mg total) by mouth as needed for migraine. May repeat in 2 hours if needed.  Maximum 2 tablets in 24 hours. 9 tablet 11   sertraline (ZOLOFT) 100 MG tablet Take 100 mg by mouth daily.  3   topiramate (TOPAMAX) 25 MG tablet Take 1 tablet (25 mg total) by mouth at bedtime. 30 tablet 5   No current facility-administered medications on file prior to visit.    ALLERGIES: No Known Allergies  FAMILY HISTORY: Family History  Problem Relation Age of Onset   Hypertension Paternal Grandmother    Migraines Paternal Grandmother    Anemia Paternal Grandmother    Cancer Maternal Grandmother        lymphoma    Dementia Maternal Grandmother    Asthma Mother    Scoliosis Brother       Objective:  Blood pressure 112/75, pulse 83, height 5\' 5"  (1.651 m), weight 192 lb 3.2 oz (87.2 kg), last menstrual period 05/29/2012, SpO2 100%, unknown if currently breastfeeding. General: No acute distress.  Patient appears well-groomed.    Shon Millet, DO  CC: Fatima Sanger, FNP

## 2023-09-07 ENCOUNTER — Ambulatory Visit: Payer: No Typology Code available for payment source | Admitting: Neurology

## 2023-09-07 ENCOUNTER — Encounter: Payer: Self-pay | Admitting: Neurology

## 2023-09-07 VITALS — BP 112/75 | HR 83 | Ht 65.0 in | Wt 192.2 lb

## 2023-09-07 DIAGNOSIS — G4719 Other hypersomnia: Secondary | ICD-10-CM

## 2023-09-07 DIAGNOSIS — G43009 Migraine without aura, not intractable, without status migrainosus: Secondary | ICD-10-CM | POA: Diagnosis not present

## 2023-09-07 DIAGNOSIS — R0683 Snoring: Secondary | ICD-10-CM

## 2023-09-07 DIAGNOSIS — M542 Cervicalgia: Secondary | ICD-10-CM | POA: Diagnosis not present

## 2023-09-07 MED ORDER — TOPIRAMATE 50 MG PO TABS: 50.0000 mg | ORAL_TABLET | Freq: Every day | ORAL | 5 refills | Status: DC

## 2023-09-07 NOTE — Patient Instructions (Signed)
Increase topiramate to 50mg  at bedtime.  If no improvement in 4 weeks, contact me Rizatriptan as needed.  Limit use of pain relievers to no more than 2 days out of week to prevent risk of rebound or medication-overuse headache. Refer to physical therapy for neck pain Use non-contoured memory foam pillow Keep headache diary Follow up 6 months.

## 2023-11-20 ENCOUNTER — Encounter: Payer: Self-pay | Admitting: Neurology

## 2024-01-05 ENCOUNTER — Other Ambulatory Visit: Payer: Self-pay | Admitting: Neurology

## 2024-03-05 DIAGNOSIS — Z0000 Encounter for general adult medical examination without abnormal findings: Secondary | ICD-10-CM | POA: Diagnosis not present

## 2024-03-07 ENCOUNTER — Ambulatory Visit: Payer: No Typology Code available for payment source | Admitting: Neurology

## 2024-03-10 NOTE — Progress Notes (Unsigned)
 NEUROLOGY FOLLOW UP OFFICE NOTE  Stacey Estrada [id number]  Assessment/Plan:   Migraine without aura, without status migrainosus, not intractable Neck pain Apneic spells in sleep, excessive daytime somnolence - query sleep apnea   Migraine prevention:  Topiramate 50mg  at bedtime *** Migraine rescue:  rizatriptan 10mg .  *** Limit use of pain relievers to no more than 2 days out of week to prevent risk of rebound or medication-overuse headache. Keep headache diary Follow up 6 months   Subjective:  Stacey Estrada is a 48 year old female with exercise-induced asthma who follows up for migraines.  UPDATE: Increased topiramate Referred to PT for neck pain.  *** Sleep medicine ?  ***  Overall notes improvement.  Significant improvement at first but lately occurring more frequent for the last 2-3 months.  Intensity:  6/10 Duration:  starts to decrease in an hour with rizatriptan over 2 days Frequency:  once a week.  Frequency of abortive medication: 8 days a month Current NSAIDS/analgesics:  none Current triptans:  rizatriptan 10mg  Current ergotamine:  none Current anti-emetic:  none Current muscle relaxants:  cyclobenzaprine 5mg  (neck pain) Current Antihypertensive medications:  none Current Antidepressant medications:  sertraline 100mg  daily Current Anticonvulsant medications:  topiramate 50mg  at bedtime Current anti-CGRP:  none Current Vitamins/Herbal/Supplements:  magnesium 400mg  daily, B12, D, fish oil Current Antihistamines/Decongestants:  Xyzal Other therapy:  none Birth control/hormone:  estradiol, progesterone       Caffeine:  Dr. Reino Kent daily.  Sweet tea.  No coffee Alcohol:  no Smoker:  yes cigarettes - 1 every 3-4 days Diet:  16 oz water daily.  Dr. Reino Kent.  Skips breakfast Exercise:  no Depression:  stable; Anxiety:  stable Other pain:  no Sleep hygiene:  Snores.  Husband says she chokes in her sleep.  Daytime fatigue.  She had a home sleep study in  2019 that did not reveal any significant obstructive sleep apnea.  HISTORY:  Onset:  headaches off and on for many years but increased frequency and duration since 2023 Location:  bilateral retro-orbital/occipital radiating in band-like distribution Quality:  pressure Intensity:  6/10.   Aura:  absent Prodrome:  absent Associated symptoms:  sometimes nausea, hard to concentrate, bilateral/paraspinal neck pain, photophobia, phonophobia.  She denies associated visual disturbance, vomiting, numbness or weakness. Duration:  at least 2-3 days Frequency:  at least once a week Frequency of abortive medication: 4 to 5 days a week Triggers:  unknown Relieving factors:  sometimes sleeping Activity:  movement and routine daily activity aggravates it   Eye exam was unremarkable.   Past NSAIDS/analgesics:  ASA, naproxen, BC powder, ibuprofen, acetaminophen Past abortive triptans:  none Past abortive ergotamine:  none Past muscle relaxants:  methocarbamol Past anti-emetic:  none Past antihypertensive medications:  none Past antidepressant medications:  none Past anticonvulsant medications:  none Past anti-CGRP:  none Past vitamins/Herbal/Supplements:  none Past antihistamines/decongestants:  meclizine Other past therapies:  none    Family history of headache:  Paternal grandmother (migraines)  PAST MEDICAL HISTORY: Past Medical History:  Diagnosis Date   Anemia    low iron;iron supplements in past, however stomach upset   Anxiety    No meds   Asthma    Triggered by streunous exercise, anxiety and heat;inhaler prn   Cyst (solitary) of breast 2013   GYN felt a  cyst in rt breast;upper area and lower area   Infection    UTI;hx of frequent UTI;last UTI 2012 x 1   NSVD (normal spontaneous vaginal delivery) 02/20/2013  Ovarian cyst    Ruptured ovarian cyst    Around late teens/early 20's   Type B blood, Rh negative 08/22/2012    MEDICATIONS: Current Outpatient Medications on File  Prior to Visit  Medication Sig Dispense Refill   albuterol (PROVENTIL HFA;VENTOLIN HFA) 108 (90 BASE) MCG/ACT inhaler Inhale 2 puffs into the lungs every 6 (six) hours as needed for wheezing. (Patient not taking: Reported on 03/08/2023) 1 Inhaler 2   cyclobenzaprine (FLEXERIL) 5 MG tablet Take 5 mg by mouth daily as needed.     estradiol (CLIMARA - DOSED IN MG/24 HR) 0.05 mg/24hr patch Place 0.05 mg onto the skin once a week.     progesterone (PROMETRIUM) 100 MG capsule Take 100 mg by mouth daily.     rizatriptan (MAXALT-MLT) 10 MG disintegrating tablet Take 1 tablet (10 mg total) by mouth as needed for migraine. May repeat in 2 hours if needed.  Maximum 2 tablets in 24 hours. 9 tablet 11   sertraline (ZOLOFT) 100 MG tablet Take 150 mg by mouth daily.  3   topiramate (TOPAMAX) 50 MG tablet TAKE 1 TABLET BY MOUTH EVERYDAY AT BEDTIME 90 tablet 0   No current facility-administered medications on file prior to visit.    ALLERGIES: No Known Allergies  FAMILY HISTORY: Family History  Problem Relation Age of Onset   Hypertension Paternal Grandmother    Migraines Paternal Grandmother    Anemia Paternal Grandmother    Cancer Maternal Grandmother        lymphoma    Dementia Maternal Grandmother    Asthma Mother    Scoliosis Brother       Objective:  *** General: No acute distress.  Patient appears well-groomed.   Head:  Normocephalic/atraumatic Neck:  Supple.  No paraspinal tenderness.  Full range of motion. Heart:  Regular rate and rhythm. Neuro:  Alert and oriented.  Speech fluent and not dysarthric.  Language intact.  CN II-XII intact.  Bulk and tone normal.  Muscle strength 5/5 throughout.  Deep tendon reflexes 2+ throughout.  Gait normal.  Romberg negative.   Shon Millet, DO  CC: Fatima Sanger, FNP

## 2024-03-11 ENCOUNTER — Ambulatory Visit: Payer: No Typology Code available for payment source | Admitting: Neurology

## 2024-03-11 ENCOUNTER — Encounter: Payer: Self-pay | Admitting: Neurology

## 2024-03-11 VITALS — BP 97/61 | HR 71 | Ht 65.0 in | Wt 193.0 lb

## 2024-03-11 DIAGNOSIS — G4719 Other hypersomnia: Secondary | ICD-10-CM

## 2024-03-11 DIAGNOSIS — G43009 Migraine without aura, not intractable, without status migrainosus: Secondary | ICD-10-CM | POA: Diagnosis not present

## 2024-03-11 DIAGNOSIS — M542 Cervicalgia: Secondary | ICD-10-CM | POA: Diagnosis not present

## 2024-03-11 DIAGNOSIS — R0683 Snoring: Secondary | ICD-10-CM | POA: Diagnosis not present

## 2024-03-11 MED ORDER — TOPIRAMATE 100 MG PO TABS: 100.0000 mg | ORAL_TABLET | Freq: Every day | ORAL | 5 refills | Status: AC

## 2024-03-11 MED ORDER — SUMATRIPTAN SUCCINATE 100 MG PO TABS: 100.0000 mg | ORAL_TABLET | ORAL | 5 refills | Status: AC | PRN

## 2024-03-11 NOTE — Addendum Note (Signed)
 Addended by: Leida Lauth on: 03/11/2024 04:30 PM   Modules accepted: Orders

## 2024-03-11 NOTE — Patient Instructions (Addendum)
 Increase topiramate to 100mg  at bedtime.  If no improvement in 6 weeks, contact me Stop rizatriptan.  Instead take sumatriptan.  If ineffective, contact us. Refer to sleep medicine Refer to physical therapy for neck pain Follow up 6 months

## 2024-03-12 DIAGNOSIS — Z0000 Encounter for general adult medical examination without abnormal findings: Secondary | ICD-10-CM | POA: Diagnosis not present

## 2024-03-12 DIAGNOSIS — E559 Vitamin D deficiency, unspecified: Secondary | ICD-10-CM | POA: Diagnosis not present

## 2024-04-03 DIAGNOSIS — E66811 Obesity, class 1: Secondary | ICD-10-CM | POA: Diagnosis not present

## 2024-04-03 DIAGNOSIS — Z6831 Body mass index (BMI) 31.0-31.9, adult: Secondary | ICD-10-CM | POA: Diagnosis not present

## 2024-04-10 ENCOUNTER — Other Ambulatory Visit: Payer: Self-pay | Admitting: Neurology

## 2024-05-08 ENCOUNTER — Other Ambulatory Visit: Payer: Self-pay | Admitting: Obstetrics and Gynecology

## 2024-05-08 DIAGNOSIS — Z1231 Encounter for screening mammogram for malignant neoplasm of breast: Secondary | ICD-10-CM

## 2024-07-09 DIAGNOSIS — Z1211 Encounter for screening for malignant neoplasm of colon: Secondary | ICD-10-CM | POA: Diagnosis not present

## 2024-07-09 DIAGNOSIS — Z1231 Encounter for screening mammogram for malignant neoplasm of breast: Secondary | ICD-10-CM | POA: Diagnosis not present

## 2024-07-09 DIAGNOSIS — Z6831 Body mass index (BMI) 31.0-31.9, adult: Secondary | ICD-10-CM | POA: Diagnosis not present

## 2024-07-09 DIAGNOSIS — Z01419 Encounter for gynecological examination (general) (routine) without abnormal findings: Secondary | ICD-10-CM | POA: Diagnosis not present

## 2024-07-09 DIAGNOSIS — Z79890 Hormone replacement therapy (postmenopausal): Secondary | ICD-10-CM | POA: Diagnosis not present

## 2024-09-22 NOTE — Progress Notes (Deleted)
 NEUROLOGY FOLLOW UP OFFICE NOTE  Stacey Estrada [id number]  Assessment/Plan:   Migraine without aura, without status migrainosus, not intractable Neck pain Apneic spells in sleep, excessive daytime somnolence - query sleep apnea   Migraine prevention:  Increase topiramate  to 100mg  at bedtime.  If no improvement in 6 weeks, would change to CGRP inhibitor. Migraine rescue:  Change from rizatriptan  to sumatriptan  100mg .  If ineffective, will have her try samples of Ubrelvy 100mg  Refer to sleep medicine for evaluation of OSA Refer to PT for treatment of neck pain Limit use of pain relievers to no more than 2 days out of week to prevent risk of rebound or medication-overuse headache. Keep headache diary Follow up 6 months   Subjective:  Stacey Estrada is a 48 year old female with exercise-induced asthma who follows up for migraines.  UPDATE: Increased topiramate  to 100mg  at bedtime. ***  Intensity:  6/10 Duration:  starts to decrease in an hour with rizatriptan  over 2 days Frequency:  once a week.  She never saw the sleep specialist.  She doesn't know if she received a call.   Frequency of abortive medication: 8 days a month Current NSAIDS/analgesics:  none Current triptans:  rizatriptan  10mg  Current ergotamine:  none Current anti-emetic:  none Current muscle relaxants:  cyclobenzaprine 5mg  (neck pain) Current Antihypertensive medications:  none Current Antidepressant medications:  sertraline 100mg  daily Current Anticonvulsant medications:  topiramate  100mg  at bedtime Current anti-CGRP:  none Current Vitamins/Herbal/Supplements:  magnesium  400mg  daily, B12, D, fish oil Current Antihistamines/Decongestants:  Xyzal Other therapy:  none Birth control/hormone:  estradiol, progesterone       Caffeine:  Dr. Nunzio daily.  Sweet tea.  No coffee Alcohol:  no Smoker:  yes cigarettes - 1 every 3-4 days Diet:  16 oz water daily.  Dr. Nunzio.  Skips breakfast Exercise:   no Depression:  stable; Anxiety:  stable Other pain:  no Sleep hygiene:  Snores.  Husband says she chokes in her sleep.  Daytime fatigue.  She had a home sleep study in 2019 that did not reveal any significant obstructive sleep apnea.  HISTORY:  Onset:  headaches off and on for many years but increased frequency and duration since 2023 Location:  bilateral retro-orbital/occipital radiating in band-like distribution Quality:  pressure Intensity:  6/10.   Aura:  absent Prodrome:  absent Associated symptoms:  sometimes nausea, hard to concentrate, bilateral/paraspinal neck pain, photophobia, phonophobia.  She denies associated visual disturbance, vomiting, numbness or weakness. Duration:  at least 2-3 days Frequency:  at least once a week Frequency of abortive medication: 4 to 5 days a week Triggers:  unknown Relieving factors:  sometimes sleeping Activity:  movement and routine daily activity aggravates it   Eye exam was unremarkable.   Past NSAIDS/analgesics:  ASA, naproxen, BC powder, ibuprofen , acetaminophen  Past abortive triptans:  none Past abortive ergotamine:  none Past muscle relaxants:  methocarbamol Past anti-emetic:  none Past antihypertensive medications:  none Past antidepressant medications:  none Past anticonvulsant medications:  none Past anti-CGRP:  none Past vitamins/Herbal/Supplements:  none Past antihistamines/decongestants:  meclizine Other past therapies:  none    Family history of headache:  Paternal grandmother (migraines)  PAST MEDICAL HISTORY: Past Medical History:  Diagnosis Date   Anemia    low iron;iron supplements in past, however stomach upset   Anxiety    No meds   Asthma    Triggered by streunous exercise, anxiety and heat;inhaler prn   Cyst (solitary) of breast 2013   GYN felt  a  cyst in rt breast;upper area and lower area   Infection    UTI;hx of frequent UTI;last UTI 2012 x 1   NSVD (normal spontaneous vaginal delivery) 02/20/2013    Ovarian cyst    Ruptured ovarian cyst    Around late teens/early 20's   Type B blood, Rh negative 08/22/2012    MEDICATIONS: Current Outpatient Medications on File Prior to Visit  Medication Sig Dispense Refill   albuterol  (PROVENTIL  HFA;VENTOLIN  HFA) 108 (90 BASE) MCG/ACT inhaler Inhale 2 puffs into the lungs every 6 (six) hours as needed for wheezing. 1 Inhaler 2   cyclobenzaprine (FLEXERIL) 5 MG tablet Take 5 mg by mouth daily as needed.     estradiol (CLIMARA - DOSED IN MG/24 HR) 0.05 mg/24hr patch Place 0.05 mg onto the skin once a week.     progesterone (PROMETRIUM) 100 MG capsule Take 100 mg by mouth daily.     sertraline (ZOLOFT) 100 MG tablet Take 100 mg by mouth daily. 1.5 cap     SUMAtriptan  (IMITREX ) 100 MG tablet Take 1 tablet (100 mg total) by mouth as needed for migraine. May repeat in 2 hours if headache persists or recurs.  Maximum 2 tablets in 24 hours. 10 tablet 5   topiramate  (TOPAMAX ) 100 MG tablet Take 1 tablet (100 mg total) by mouth at bedtime. 30 tablet 5   No current facility-administered medications on file prior to visit.    ALLERGIES: No Known Allergies  FAMILY HISTORY: Family History  Problem Relation Age of Onset   Hypertension Paternal Grandmother    Migraines Paternal Grandmother    Anemia Paternal Grandmother    Cancer Maternal Grandmother        lymphoma    Dementia Maternal Grandmother    Asthma Mother    Scoliosis Brother       Objective:  *** General: No acute distress.  Patient appears well-groomed.   ***   Juliene Dunnings, DO  CC: Ronal Leeroy Marie, FNP

## 2024-09-23 ENCOUNTER — Ambulatory Visit: Admitting: Neurology
